# Patient Record
Sex: Female | Born: 1990 | Race: White | Hispanic: No | Marital: Married | Smoking: Never smoker
Health system: Southern US, Community
[De-identification: ages and names within clinical notes are randomized; demographics above are authoritative.]

## PROBLEM LIST (undated history)

## (undated) DIAGNOSIS — O2340 Unspecified infection of urinary tract in pregnancy, unspecified trimester: Secondary | ICD-10-CM

## (undated) DIAGNOSIS — M1990 Unspecified osteoarthritis, unspecified site: Secondary | ICD-10-CM

## (undated) DIAGNOSIS — T7840XA Allergy, unspecified, initial encounter: Secondary | ICD-10-CM

## (undated) DIAGNOSIS — M259 Joint disorder, unspecified: Secondary | ICD-10-CM

## (undated) DIAGNOSIS — D480 Neoplasm of uncertain behavior of bone and articular cartilage: Secondary | ICD-10-CM

## (undated) HISTORY — DX: Unspecified osteoarthritis, unspecified site: M19.90

## (undated) HISTORY — DX: Joint disorder, unspecified: M25.9

## (undated) HISTORY — DX: Allergy, unspecified, initial encounter: T78.40XA

## (undated) HISTORY — PX: NO PAST SURGERIES: SHX2092

## (undated) HISTORY — DX: Neoplasm of uncertain behavior of bone and articular cartilage: D48.0

## (undated) HISTORY — PX: SHOULDER SURGERY: SHX246

---
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
## 2011-01-12 ENCOUNTER — Inpatient Hospital Stay (HOSPITAL_COMMUNITY)
Admission: AD | Admit: 2011-01-12 | Discharge: 2011-01-12 | Disposition: A | Discharge status: Home / Self Care | Source: Ambulatory Visit | Attending: Obstetrics & Gynecology | Admitting: Obstetrics & Gynecology

## 2011-01-12 ENCOUNTER — Encounter (HOSPITAL_COMMUNITY): Payer: Self-pay | Admitting: *Deleted

## 2011-01-12 ENCOUNTER — Inpatient Hospital Stay (HOSPITAL_COMMUNITY)

## 2011-01-12 DIAGNOSIS — B9689 Other specified bacterial agents as the cause of diseases classified elsewhere: Secondary | ICD-10-CM | POA: Insufficient documentation

## 2011-01-12 DIAGNOSIS — Z6791 Unspecified blood type, Rh negative: Secondary | ICD-10-CM

## 2011-01-12 DIAGNOSIS — N760 Acute vaginitis: Secondary | ICD-10-CM | POA: Insufficient documentation

## 2011-01-12 DIAGNOSIS — O26899 Other specified pregnancy related conditions, unspecified trimester: Secondary | ICD-10-CM

## 2011-01-12 DIAGNOSIS — A499 Bacterial infection, unspecified: Secondary | ICD-10-CM | POA: Insufficient documentation

## 2011-01-12 DIAGNOSIS — O2390 Unspecified genitourinary tract infection in pregnancy, unspecified trimester: Secondary | ICD-10-CM | POA: Insufficient documentation

## 2011-01-12 DIAGNOSIS — O209 Hemorrhage in early pregnancy, unspecified: Secondary | ICD-10-CM

## 2011-01-12 DIAGNOSIS — O26859 Spotting complicating pregnancy, unspecified trimester: Secondary | ICD-10-CM | POA: Insufficient documentation

## 2011-01-12 HISTORY — DX: Unspecified infection of urinary tract in pregnancy, unspecified trimester: O23.40

## 2011-01-12 LAB — URINALYSIS, ROUTINE W REFLEX MICROSCOPIC
Bilirubin Urine: NEGATIVE
Ketones, ur: NEGATIVE mg/dL
pH: 6.5 (ref 5.0–8.0)

## 2011-01-12 LAB — WET PREP, GENITAL: Yeast Wet Prep HPF POC: NONE SEEN

## 2011-01-12 LAB — URINE MICROSCOPIC-ADD ON

## 2011-01-12 MED ORDER — RHO D IMMUNE GLOBULIN 1500 UNIT/2ML IJ SOLN
300.0000 ug | Freq: Once | INTRAMUSCULAR | Status: AC
  Administered 2011-01-12: 300 ug via INTRAMUSCULAR

## 2011-01-12 MED ORDER — METRONIDAZOLE 500 MG PO TABS: 500.0000 mg | ORAL_TABLET | Freq: Two times a day (BID) | ORAL | Status: AC

## 2011-01-12 NOTE — Progress Notes (Signed)
Written and verbal d/c instructions given and understanding voiced. 

## 2011-01-12 NOTE — ED Provider Notes (Signed)
History   Jenna Mendoza is a 20 y.o. year old G1P0 female at [redacted]w[redacted]d weeks gestation who presents to [facility] reporting spotting since thsi evening. She denies cramping or passage of tissue. She is getting Prenatal care through the [facility] [employer] and has a normal [place] a few weeks ago and states her blood type is A neg. She denies receiving Rhophylac.   CSN: [id number]  Arrival date & time 01/12/11  2024   None     Chief Complaint  Patient presents with  . Vaginal Bleeding    (Consider location/radiation/quality/duration/timing/severity/associated sxs/prior treatment) HPI  Past Medical History  Diagnosis Date  . Urinary tract infection during pregnancy mid december 2012    Past Surgical History  Procedure Date  . No past surgeries     Family History  Problem Relation Age of Onset  . Anesthesia problems Neg Hx   . Hypotension Neg Hx   . Malignant hyperthermia Neg Hx   . Pseudochol deficiency Neg Hx     History  Substance Use Topics  . Smoking status: Never Smoker   . Smokeless tobacco: Not on file  . Alcohol Use: No    OB History    Grav Para Term Preterm Abortions TAB SAB Ect Mult Living   1               Review of Systems: Otherwise neg  Allergies  Review of patient's allergies indicates no known allergies.  Home Medications  No current outpatient prescriptions on file.  BP 149/73  Pulse 113  Temp(Src) 98.8 F (37.1 C) (Oral)  Resp 20  Ht 5\' 6"  (1.676 m)  Wt 63.957 kg (141 lb)  BMI 22.76 kg/m2  SpO2 99%  Physical Exam  Constitutional: She is oriented to person, place, and time. She appears well-developed and well-nourished. No distress.  Cardiovascular: Normal rate.   Pulmonary/Chest: Effort normal.  Abdominal: Soft. There is no tenderness.  Genitourinary: Uterus is not tender. Cervix exhibits friability. Cervix exhibits no motion tenderness and no discharge. There is bleeding (small amount in DRB in vault.) around the vagina. No vaginal discharge found.    Neurological: She is alert and oriented to person, place, and time.  Skin: Skin is warm and dry.  Psychiatric: She has a normal mood and affect.   Results for orders placed during the hospital encounter of 01/12/11 (from the past 48 hour(s))  RH IG WORKUP, [facility]     Status: Normal (Preliminary result)   Collection Time   01/12/11  9:39 PM      Component Value Range Comment   Gestational Age(Wks) 11.6      ABO/RH(D) A NEG      Antibody Screen NEG      Unit Number [id number]      Blood Component Type RHIG      Unit division 00      Status of Unit ISSUED      Transfusion Status OK TO TRANSFUSE     WET PREP, GENITAL     Status: Abnormal   Collection Time   01/12/11 10:30 PM      Component Value Range Comment   Yeast, Wet Prep NONE SEEN  NONE SEEN     Trich, Wet Prep NONE SEEN  NONE SEEN     Clue Cells, Wet Prep FEW (*) NONE SEEN     WBC, Wet Prep HPF POC FEW (*) NONE SEEN  MANY BACTERIA SEEN   US Ob Comp Less 14 Wks  01/12/2011  *  RADIOLOGY REPORT*  Clinical Data: First trimester pregnancy.  Spotting.  OBSTETRIC <14 WK [place] AND TRANSVAGINAL OB US  Technique: Both transabdominal and transvaginal ultrasound examinations were performed for complete evaluation of the gestation as well as the maternal uterus, adnexal regions, and pelvic cul-de-sac.  Findings: A single living intrauterine pregnancy is noted with cardiac activity along 155 beats per minute and crown-rump length of 50.8 mm compatible with 11 weeks 6 days gestation, which corresponds to the assigned gestational age.  Yolk sac is visualized.  No subchorionic hemorrhage is observed.  Both ovaries appear normal, with a 1.9 cm left corpus luteum of pregnancy noted.  There is a trace amount of maternal free pelvic fluid.  IMPRESSION:  1.  Single living intrauterine pregnancy measuring at 11 weeks 6 days gestation.  No subchorionic hemorrhage. 2.  Trace free pelvic fluid.  Original Report Authenticated By: Dellia Cloud,  M.D.   FHR 161 by doppler ED Course  Procedures (including critical care time)  Rhophylac given MDM  Assessment: 1. 11.6 week IUP 2. First trimester spotting likely due to friable cervix and BV 3. Rh neg, Rhophylac given  Plan: 1. D/C home 2. Pelvic rest x 1 week 3. F/U w/ Obstetrician on [place] or MAU PRN for worsening of Sx. 4. Bleeding precautions  Katrinka Blazing, Sydell Prowell 01/12/2011 10:53 PM

## 2011-01-12 NOTE — Progress Notes (Signed)
G1 at 12 wks. Spotting noticed at 1900. Brownish at first and then light pink. Scant. Denies pain. Last IC Thurs afternoon

## 2011-01-12 NOTE — ED Notes (Signed)
Rhophylac literature given to pt earlier and states does not have any questions currently regarding injection.

## 2011-01-12 NOTE — Progress Notes (Signed)
V.Smith CNM in to see pt. Spec exam done and wet prep and GC/Chlam obtained. Pt tol well.

## 2011-01-13 LAB — RH IG WORKUP (INCLUDES ABO/RH)
ABO/RH(D): A NEG
Antibody Screen: NEGATIVE
Gestational Age(Wks): 11.6

## 2011-01-15 LAB — GC/CHLAMYDIA PROBE AMP, GENITAL
Chlamydia, DNA Probe: NEGATIVE
GC Probe Amp, Genital: NEGATIVE

## 2013-11-16 ENCOUNTER — Encounter (HOSPITAL_COMMUNITY): Payer: Self-pay | Admitting: *Deleted

## 2014-09-07 ENCOUNTER — Ambulatory Visit (INDEPENDENT_AMBULATORY_CARE_PROVIDER_SITE_OTHER): Payer: BLUE CROSS/BLUE SHIELD | Admitting: Family Medicine

## 2014-09-07 ENCOUNTER — Encounter (INDEPENDENT_AMBULATORY_CARE_PROVIDER_SITE_OTHER): Payer: Self-pay

## 2014-09-07 ENCOUNTER — Encounter: Payer: Self-pay | Admitting: Family Medicine

## 2014-09-07 VITALS — BP 117/74 | HR 68 | Temp 97.6°F | Ht 66.0 in | Wt 187.2 lb

## 2014-09-07 DIAGNOSIS — R300 Dysuria: Secondary | ICD-10-CM

## 2014-09-07 DIAGNOSIS — N3000 Acute cystitis without hematuria: Secondary | ICD-10-CM

## 2014-09-07 LAB — POCT UA - MICROSCOPIC ONLY
BACTERIA, U MICROSCOPIC: NEGATIVE
CASTS, UR, LPF, POC: NEGATIVE
CRYSTALS, UR, HPF, POC: NEGATIVE
RBC, URINE, MICROSCOPIC: NEGATIVE
Yeast, UA: NEGATIVE

## 2014-09-07 LAB — POCT URINALYSIS DIPSTICK
GLUCOSE UA: NEGATIVE
Ketones, UA: NEGATIVE
NITRITE UA: NEGATIVE
Protein, UA: NEGATIVE
SPEC GRAV UA: 1.01
Urobilinogen, UA: NEGATIVE
pH, UA: 6.5

## 2014-09-07 MED ORDER — CEFTRIAXONE SODIUM 1 G IJ SOLR
1.0000 g | INTRAMUSCULAR | Status: AC
  Administered 2014-09-07: 1 g via INTRAMUSCULAR

## 2014-09-07 NOTE — Progress Notes (Signed)
BP 117/74 mmHg  Pulse 68  Temp(Src) 97.6 F (36.4 C) (Oral)  Ht 5\' 6"  (1.676 m)  Wt 187 lb 3.2 oz (84.913 kg)  BMI 30.23 kg/m2  Breastfeeding? Unknown   Subjective:    Patient ID: [name], female    DOB: Dec 30, 1990, 24 y.o.   MRN: [id number]  HPI: LUCIE FRIEDLANDER is a 24 y.o. female presenting on 09/07/2014 for Urinary Tract Infection   HPI UTI Patient has had dysuria and bladder fullness for the past day and a half. She does get recurrent UTIs every few months but this time it's been at least 4-5 months as his last one. She has taken different antibiotics for these previously. She denies any fevers or chills, she denies any abdominal pain, she denies any hematuria. She denies any vaginal irritation, vaginal discharge. She denies any flank pain  Relevant past medical, surgical, family and social history reviewed and updated as indicated. Interim medical history since our last visit reviewed. Allergies and medications reviewed and updated.  Review of Systems  Constitutional: Negative for fever and chills.  HENT: Negative for congestion, ear discharge and ear pain.   Eyes: Negative for redness and visual disturbance.  Respiratory: Negative for chest tightness and shortness of breath.   Cardiovascular: Negative for chest pain and leg swelling.  Gastrointestinal: Negative for abdominal pain.  Genitourinary: Positive for dysuria. Negative for urgency, hematuria, flank pain, difficulty urinating and dyspareunia.  Musculoskeletal: Negative for back pain and gait problem.  Skin: Negative for rash.  Neurological: Negative for dizziness, light-headedness and headaches.  Psychiatric/Behavioral: Negative for behavioral problems and agitation.  All other systems reviewed and are negative.   Per HPI unless specifically indicated above Social History  Substance Use Topics  . Smoking status: Never Smoker   . Smokeless tobacco: None  . Alcohol Use: 0.6 oz/week    1 Standard  drinks or equivalent per week   Family History  Problem Relation Age of Onset  . Anesthesia problems Neg Hx   . Hypotension Neg Hx   . Malignant hyperthermia Neg Hx   . Pseudochol deficiency Neg Hx   . Diabetes Mellitus II Paternal Grandfather   . Hypertension Father   . Breast cancer Maternal Grandmother   . Schizophrenia Mother   . Bipolar disorder Mother       Medication List       This list is accurate as of: 09/07/14  3:59 PM.  Always use your most recent med list.               levonorgestrel 20 MCG/24HR IUD  Commonly known as:  MIRENA  1 each by Intrauterine route once.           Objective:    BP 117/74 mmHg  Pulse 68  Temp(Src) 97.6 F (36.4 C) (Oral)  Ht 5\' 6"  (1.676 m)  Wt 187 lb 3.2 oz (84.913 kg)  BMI 30.23 kg/m2  Breastfeeding? Unknown  Wt Readings from Last 3 Encounters:  09/07/14 187 lb 3.2 oz (84.913 kg)  01/12/11 141 lb (63.957 kg) (70 %*, Z = 0.53)   * Growth percentiles are based on CDC 2-20 Years data.    Physical Exam  Constitutional: She is oriented to person, place, and time. She appears well-developed and well-nourished. No distress.  Eyes: Conjunctivae and EOM are normal.  Cardiovascular: Normal rate and regular rhythm.   No murmur heard. Pulmonary/Chest: Effort normal and breath sounds normal. No respiratory distress. She has no wheezes.  Abdominal: Soft. Normal appearance and bowel sounds are normal. There is no hepatosplenomegaly. There is no tenderness (no tenderness to palpation). There is no rebound, no guarding and no CVA tenderness.  Musculoskeletal: Normal range of motion. She exhibits no edema or tenderness.  Neurological: She is alert and oriented to person, place, and time. Coordination normal.  Skin: Skin is warm and dry. No rash noted. She is not diaphoretic.  Psychiatric: She has a normal mood and affect. Her behavior is normal.  Vitals reviewed.   Results for orders placed or performed in visit on 09/07/14  POCT  UA - Microscopic Only  Result Value Ref Range   WBC, Ur, HPF, POC 10-15    RBC, urine, microscopic neg    Bacteria, U Microscopic neg    Mucus, UA occ    Epithelial cells, urine per micros mod    Crystals, Ur, HPF, POC neg    Casts, Ur, LPF, POC neg    Yeast, UA neg   POCT urinalysis dipstick  Result Value Ref Range   Color, UA orange    Clarity, UA clear    Glucose, UA neg    Bilirubin, UA small    Ketones, UA neg    Spec Grav, UA 1.010    Blood, UA small    pH, UA 6.5    Protein, UA neg    Urobilinogen, UA negative    Nitrite, UA neg    Leukocytes, UA small (1+) (A) Negative      Assessment & Plan:   Problem List Items Addressed This Visit    None    Visit Diagnoses    Dysuria    -  Primary    Relevant Orders    POCT UA - Microscopic Only (Completed)    POCT urinalysis dipstick (Completed)    Acute cystitis without hematuria        Patient has recurrent UTIs but RBCs a gynecological physician for this. Just needs treatment for this UTI today    Relevant Medications    cefTRIAXone (ROCEPHIN) injection 1 g (Completed)        Follow up plan: Return if symptoms worsen or fail to improve.  Caryl Pina, MD [facility] 09/07/2014, 3:59 PM

## 2014-09-07 NOTE — Patient Instructions (Signed)

## 2014-09-13 ENCOUNTER — Encounter: Payer: Self-pay | Admitting: Family Medicine

## 2014-09-13 ENCOUNTER — Ambulatory Visit (INDEPENDENT_AMBULATORY_CARE_PROVIDER_SITE_OTHER): Payer: BLUE CROSS/BLUE SHIELD | Admitting: Family Medicine

## 2014-09-13 DIAGNOSIS — M259 Joint disorder, unspecified: Secondary | ICD-10-CM | POA: Insufficient documentation

## 2014-09-15 NOTE — Progress Notes (Signed)
Canceled encounter 

## 2014-11-12 ENCOUNTER — Ambulatory Visit (INDEPENDENT_AMBULATORY_CARE_PROVIDER_SITE_OTHER): Admitting: Family Medicine

## 2014-11-12 ENCOUNTER — Encounter: Payer: Self-pay | Admitting: Family Medicine

## 2014-11-12 VITALS — BP 120/71 | HR 84 | Temp 97.8°F | Ht 66.0 in | Wt 177.2 lb

## 2014-11-12 DIAGNOSIS — Z23 Encounter for immunization: Secondary | ICD-10-CM

## 2014-11-12 DIAGNOSIS — L739 Follicular disorder, unspecified: Secondary | ICD-10-CM

## 2014-11-12 MED ORDER — SULFAMETHOXAZOLE-TRIMETHOPRIM 800-160 MG PO TABS: 1.0000 | ORAL_TABLET | Freq: Two times a day (BID) | ORAL | Status: DC

## 2014-11-12 NOTE — Progress Notes (Signed)
BP 120/71 mmHg  Pulse 84  Temp(Src) 97.8 F (36.6 C)  Ht 5\' 6"  (1.676 m)  Wt 177 lb 3.2 oz (80.377 kg)  BMI 28.61 kg/m2   Subjective:    Patient ID: Jenna Mendoza, female    DOB: [birth year], 24 y.o.   MRN: [id number]  HPI: Jenna Mendoza is a 24 y.o. female presenting on 11/12/2014 for Abcess on upper right leg/groin area   HPI Infected hair Patient has one spot on her right inner thigh which has a bump from what she thinks is an infected hair follicle. She denies any fevers or chills but there is redness in the lump in the area. She has never had similar ones before. The spot is tender to the touch. It is very small of less than a quarter centimeter in size  Relevant past medical, surgical, family and social history reviewed and updated as indicated. Interim medical history since our last visit reviewed. Allergies and medications reviewed and updated.  Review of Systems  Constitutional: Negative for fever and chills.  HENT: Negative for congestion, ear discharge and ear pain.   Eyes: Negative for redness and visual disturbance.  Respiratory: Negative for chest tightness and shortness of breath.   Cardiovascular: Negative for chest pain and leg swelling.  Genitourinary: Negative for dysuria and difficulty urinating.  Musculoskeletal: Negative for back pain and gait problem.  Skin: Positive for rash.  Neurological: Negative for light-headedness and headaches.  Psychiatric/Behavioral: Negative for behavioral problems and agitation.  All other systems reviewed and are negative.   Per HPI unless specifically indicated above     Medication List       This list is accurate as of: 11/12/14  1:43 PM.  Always use your most recent med list.               SKYLA 13.5 MG Iud  Generic drug:  Levonorgestrel  by Intrauterine route.     sulfamethoxazole-trimethoprim 800-160 MG tablet  Commonly known as:  BACTRIM DS  Take 1 tablet by mouth 2 (two) times daily.           Objective:    BP 120/71 mmHg  Pulse 84  Temp(Src) 97.8 F (36.6 C)  Ht 5\' 6"  (1.676 m)  Wt 177 lb 3.2 oz (80.377 kg)  BMI 28.61 kg/m2  Wt Readings from Last 3 Encounters:  11/12/14 177 lb 3.2 oz (80.377 kg)  09/13/14 186 lb (84.369 kg)  09/07/14 187 lb 3.2 oz (84.913 kg)    Physical Exam  Constitutional: She is oriented to person, place, and time. She appears well-developed and well-nourished. No distress.  Eyes: Conjunctivae and EOM are normal.  Cardiovascular: Normal rate, regular rhythm, normal heart sounds and intact distal pulses.   No murmur heard. Pulmonary/Chest: Effort normal and breath sounds normal. No respiratory distress. She has no wheezes.  Musculoskeletal: Normal range of motion. She exhibits no edema or tenderness.  Neurological: She is alert and oriented to person, place, and time. Coordination normal.  Skin: Skin is warm and dry. Lesion noted. No rash noted. She is not diaphoretic.     Psychiatric: She has a normal mood and affect. Her behavior is normal.  Nursing note and vitals reviewed.   Results for orders placed or performed in visit on 09/07/14  POCT UA - Microscopic Only  Result Value Ref Range   WBC, Ur, HPF, POC 10-15    RBC, urine, microscopic neg    Bacteria, U Microscopic neg  Mucus, UA occ    Epithelial cells, urine per micros mod    Crystals, Ur, HPF, POC neg    Casts, Ur, LPF, POC neg    Yeast, UA neg   POCT urinalysis dipstick  Result Value Ref Range   Color, UA orange    Clarity, UA clear    Glucose, UA neg    Bilirubin, UA small    Ketones, UA neg    Spec Grav, UA 1.010    Blood, UA small    pH, UA 6.5    Protein, UA neg    Urobilinogen, UA negative    Nitrite, UA neg    Leukocytes, UA small (1+) (A) Negative      Assessment & Plan:   Problem List Items Addressed This Visit    None    Visit Diagnoses    Folliculitis    -  Primary    We'll treat with Bactrim, she is not breast-feeding currently. If not improved in  a week come back for I&D of the area.    Relevant Medications    sulfamethoxazole-trimethoprim (BACTRIM DS) 800-160 MG tablet        Follow up plan: Return in about 1 week (around 11/19/2014), or if symptoms worsen or fail to improve, for IUD removal and well woman exam and Pap.  Caryl Pina, MD [facility] 11/12/2014, 1:43 PM

## 2014-11-19 ENCOUNTER — Ambulatory Visit (INDEPENDENT_AMBULATORY_CARE_PROVIDER_SITE_OTHER): Payer: BLUE CROSS/BLUE SHIELD | Admitting: Family Medicine

## 2014-11-19 ENCOUNTER — Encounter: Payer: Self-pay | Admitting: Family Medicine

## 2014-11-19 VITALS — BP 135/81 | HR 108 | Temp 97.1°F | Ht 66.0 in | Wt 178.2 lb

## 2014-11-19 DIAGNOSIS — Z01419 Encounter for gynecological examination (general) (routine) without abnormal findings: Secondary | ICD-10-CM | POA: Diagnosis not present

## 2014-11-19 DIAGNOSIS — Z0000 Encounter for general adult medical examination without abnormal findings: Secondary | ICD-10-CM | POA: Diagnosis not present

## 2014-11-19 DIAGNOSIS — Z30432 Encounter for removal of intrauterine contraceptive device: Secondary | ICD-10-CM | POA: Diagnosis not present

## 2014-11-19 NOTE — Patient Instructions (Signed)
Preparing for Pregnancy Before trying to become pregnant, make an appointment with your health care provider (preconception care). The goal is to help you have a healthy, safe pregnancy. At your first appointment, your health care provider will:   Do a complete physical exam, including a Pap test.  Take a complete medical history.  Give you advice and help you resolve any problems. PRECONCEPTION CHECKLIST Here is a list of the basics to cover with your health care provider at your preconception visit:  Medical history.  Tell your health care provider about any diseases you have had. Many diseases can affect your pregnancy.  Include your partner's medical history and family history.  Make sure you have been tested for sexually transmitted infections (STIs). These can affect your pregnancy. In some cases, they can be passed to your baby. Tell your health care provider about any history of STIs.  Make sure your health care provider knows about any previous problems you have had with conception or pregnancy.  Tell your health care provider about any medicine you take. This includes herbal supplements and over-the-counter medicines.  Make sure all your immunizations are up to date. You may need to make additional appointments.  Ask your health care provider if you need any vaccinations or if there are any you should avoid.  Diet.  It is especially important to eat a healthy, balanced diet with the right nutrients when you are pregnant.  Ask your health care provider to help you get to a healthy weight before pregnancy.  If you are overweight, you are at higher risk for certain complications. These include high blood pressure, diabetes, and preterm birth.  If you are underweight, you are more likely to have a low-birth-weight baby.  Lifestyle.  Tell your health care provider about lifestyle factors such as alcohol use, drug use, or smoking.  Describe any harmful substances you may  be exposed to at work or home. These can include chemicals, pesticides, and radiation.  Mental health.  Let your health care provider know if you have been feeling depressed or anxious.  Let your health care provider know if you have a history of substance abuse.  Let your health care provider know if you do not feel safe at home. HOME INSTRUCTIONS TO PREPARE FOR PREGNANCY Follow your health care provider's advice and instructions.   Keep an accurate record of your menstrual periods. This makes it easier for your health care provider to determine your baby's due date.  Begin taking prenatal vitamins and folic acid supplements daily. Take them as directed by your health care provider.  Eat a balanced diet. Get help from a nutrition counselor if you have questions or need help.  Get regular exercise. Try to be active for at least 30 minutes a day most days of the week.  Quit smoking, if you smoke.  Do not drink alcohol.  Do not take illegal drugs.  Get medical problems, such as diabetes or high blood pressure, under control.  If you have diabetes, make sure you do the following:  Have good blood sugar control. If you have type 1 diabetes, use multiple daily doses of insulin. Do not use split-dose or premixed insulin.  Have an eye exam by a qualified eye care professional trained in caring for people with diabetes.  Get evaluated by your health care provider for cardiovascular disease.  Get to a healthy weight. If you are overweight or obese, reduce your weight with the help of a qualified health   professional such as a Firefighter. Ask your health care provider what the right weight range is for you. HOW DO I KNOW I AM PREGNANT? You may be pregnant if you have been sexually active and you miss your period. Symptoms of early pregnancy include:   Mild cramping.  Very light vaginal bleeding (spotting).  Feeling unusually tired.  Morning sickness. If you have any of  these symptoms, take a home pregnancy test. These tests look for a hormone called human chorionic gonadotropin (hCG) in your urine. Your body begins to make this hormone during early pregnancy. These tests are very accurate. Wait until at least the first day you miss your period to take one. If you get a positive result, call your health care provider to make appointments for prenatal care. WHAT SHOULD I DO IF I BECOME PREGNANT?  Make an appointment with your health care provider by week 12 of your pregnancy at the latest.  Do not smoke. Smoking can be harmful to your baby.  Do not drink alcoholic beverages. Alcohol is related to a number of birth defects.  Avoid toxic odors and chemicals.  You may continue to have sexual intercourse if it does not cause pain or other problems, such as vaginal bleeding.   This information is not intended to replace advice given to you by your health care provider. Make sure you discuss any questions you have with your health care provider.   Document Released: 12/15/2007 Document Revised: 01/22/2014 Document Reviewed: 12/08/2012 Elsevier Interactive Patient Education [employer].

## 2014-11-19 NOTE — Progress Notes (Signed)
BP 135/81 mmHg  Pulse 108  Temp(Src) 97.1 F (36.2 C) (Oral)  Ht 5\' 6"  (1.676 m)  Wt 178 lb 3.2 oz (80.831 kg)  BMI 28.78 kg/m2   Subjective:    Patient ID: Jenna Mendoza, female    DOB: [birth year], 24 y.o.   MRN: [id number]  HPI: Jenna Mendoza is a 24 y.o. female presenting on 11/19/2014 for IUD removal and Annual Exam   HPI Adult well exam with gynecological exam Patient presents today for her Pap and well woman exam. She has a scaling does not have regular periods. She bleeds every couple weeks for a couple days. She has had the IUD since she had her child is now getting close to 68 years old. Patient denies headaches, blurred vision, chest pains, shortness of breath, or weakness.   IUD removal She also comes in for an IUD removal today because she is planning on having another child in getting pregnant. She currently has a Insurance underwriter and we will take that out for her.  Relevant past medical, surgical, family and social history reviewed and updated as indicated. Interim medical history since our last visit reviewed. Allergies and medications reviewed and updated.  Review of Systems  Constitutional: Negative for fever and chills.  HENT: Negative for congestion, ear discharge and ear pain.   Eyes: Negative for redness and visual disturbance.  Respiratory: Negative for chest tightness and shortness of breath.   Cardiovascular: Negative for chest pain and leg swelling.  Gastrointestinal: Negative for abdominal pain, diarrhea and constipation.  Genitourinary: Positive for menstrual problem (irregular bleeding while on Skyla). Negative for dysuria, frequency, flank pain, vaginal bleeding, vaginal discharge, difficulty urinating, vaginal pain and pelvic pain.  Musculoskeletal: Negative for back pain and gait problem.  Skin: Negative for rash.  Neurological: Negative for dizziness, light-headedness and headaches.  Psychiatric/Behavioral: Negative for behavioral problems and agitation.   All other systems reviewed and are negative.   Per HPI unless specifically indicated above     Medication List       This list is accurate as of: 11/19/14 10:49 AM.  Always use your most recent med list.               SKYLA 13.5 MG Iud  Generic drug:  Levonorgestrel  by Intrauterine route.           Objective:    BP 135/81 mmHg  Pulse 108  Temp(Src) 97.1 F (36.2 C) (Oral)  Ht 5\' 6"  (1.676 m)  Wt 178 lb 3.2 oz (80.831 kg)  BMI 28.78 kg/m2  Wt Readings from Last 3 Encounters:  11/19/14 178 lb 3.2 oz (80.831 kg)  11/12/14 177 lb 3.2 oz (80.377 kg)  09/13/14 186 lb (84.369 kg)    Physical Exam  Constitutional: She is oriented to person, place, and time. She appears well-developed and well-nourished. No distress.  Eyes: Conjunctivae and EOM are normal. Pupils are equal, round, and reactive to light.  Neck: Neck supple. No thyromegaly present.  Cardiovascular: Normal rate, regular rhythm, normal heart sounds and intact distal pulses.   No murmur heard. Pulmonary/Chest: Effort normal and breath sounds normal. No respiratory distress. She has no wheezes. Right breast exhibits no inverted nipple, no mass, no nipple discharge, no skin change and no tenderness. Left breast exhibits no inverted nipple, no mass, no nipple discharge, no skin change and no tenderness. Breasts are symmetrical.  Abdominal: Soft. Bowel sounds are normal. She exhibits no distension. There is no tenderness. There is  no rebound and no guarding.  Genitourinary: Vagina normal and uterus normal. No breast swelling, tenderness, discharge or bleeding. Pelvic exam was performed with patient supine. There is no rash or lesion on the right labia. There is no rash or lesion on the left labia. Uterus is not deviated, not enlarged, not fixed and not tender. Cervix exhibits no motion tenderness, no discharge and no friability. Right adnexum displays no mass and no tenderness. Left adnexum displays no mass and no  tenderness.    Musculoskeletal: Normal range of motion. She exhibits no edema.  Lymphadenopathy:    She has no cervical adenopathy.    She has no axillary adenopathy.  Neurological: She is alert and oriented to person, place, and time. Coordination normal.  Skin: Skin is warm and dry. No rash noted. She is not diaphoretic.  Psychiatric: She has a normal mood and affect. Her behavior is normal.  Vitals reviewed.   Results for orders placed or performed in visit on 09/07/14  POCT UA - Microscopic Only  Result Value Ref Range   WBC, Ur, HPF, POC 10-15    RBC, urine, microscopic neg    Bacteria, U Microscopic neg    Mucus, UA occ    Epithelial cells, urine per micros mod    Crystals, Ur, HPF, POC neg    Casts, Ur, LPF, POC neg    Yeast, UA neg   POCT urinalysis dipstick  Result Value Ref Range   Color, UA orange    Clarity, UA clear    Glucose, UA neg    Bilirubin, UA small    Ketones, UA neg    Spec Grav, UA 1.010    Blood, UA small    pH, UA 6.5    Protein, UA neg    Urobilinogen, UA negative    Nitrite, UA neg    Leukocytes, UA small (1+) (A) Negative   IUD removal: Strings visible on exam ring forceps used to hold the straining and IUD was removed without complication. No bleeding side effects noted.    Assessment & Plan:   Problem List Items Addressed This Visit    None    Visit Diagnoses    Annual physical exam    -  Primary    Relevant Orders    Pap IG, CT/NG w/ reflex HPV when ASC-U    Encounter for IUD removal        Has plans to have another baby and wants IUD removed. Removed without complication or issue. Instructed to start taking prenatal now        Follow up plan: Return if symptoms worsen or fail to improve.  Caryl Pina, MD [facility] 11/19/2014, 10:49 AM

## 2014-11-25 LAB — PAP IG, CT-NG, RFX HPV ASCU
Chlamydia, Nuc. Acid Amp: NEGATIVE
Gonococcus by Nucleic Acid Amp: NEGATIVE
PAP SMEAR COMMENT: 0

## 2014-12-13 ENCOUNTER — Telehealth: Payer: Self-pay | Admitting: Family Medicine

## 2014-12-13 NOTE — Telephone Encounter (Signed)
Stp and she is c/o urinary urgency, frequency and then burning at the end of voiding. We didn't have any openings today. Can we call in something for her please advise?

## 2014-12-13 NOTE — Telephone Encounter (Signed)
She would have to come in and get it checked

## 2014-12-14 ENCOUNTER — Encounter: Payer: Self-pay | Admitting: Family Medicine

## 2014-12-14 ENCOUNTER — Ambulatory Visit (INDEPENDENT_AMBULATORY_CARE_PROVIDER_SITE_OTHER): Payer: BLUE CROSS/BLUE SHIELD | Admitting: Family Medicine

## 2014-12-14 ENCOUNTER — Encounter (INDEPENDENT_AMBULATORY_CARE_PROVIDER_SITE_OTHER): Payer: Self-pay

## 2014-12-14 VITALS — BP 122/73 | HR 78 | Temp 97.7°F | Ht 66.0 in | Wt 177.8 lb

## 2014-12-14 DIAGNOSIS — R300 Dysuria: Secondary | ICD-10-CM

## 2014-12-14 DIAGNOSIS — R350 Frequency of micturition: Secondary | ICD-10-CM | POA: Diagnosis not present

## 2014-12-14 LAB — POCT UA - MICROSCOPIC ONLY
CASTS, UR, LPF, POC: NEGATIVE
CRYSTALS, UR, HPF, POC: NEGATIVE
Mucus, UA: NEGATIVE
RBC, urine, microscopic: NEGATIVE
Yeast, UA: NEGATIVE

## 2014-12-14 LAB — POCT URINALYSIS DIPSTICK

## 2014-12-14 MED ORDER — SULFAMETHOXAZOLE-TRIMETHOPRIM 400-80 MG PO TABS: 1.0000 | ORAL_TABLET | Freq: Two times a day (BID) | ORAL | Status: DC

## 2014-12-14 NOTE — Progress Notes (Signed)
   Subjective:    Patient ID: [name], female    DOB: May 27, 1990, 24 y.o.   MRN: [id number]  HPI 24 year old female with recurring symptoms of urinary tract infection. Symptoms include frequency and dysuria. She does not say any connection with sexual activity. She has had symptoms since she was a teenager before she became sexually active. I reviewed her chart to see if cultures would give [place] any suggestion of organism or appropriate antibiotic but I did not find any culture results that were relevant. The last time she was in the office and treated she received an injection of Rocephin.  Patient Active Problem List   Diagnosis Date Noted  . Joint disorder    Outpatient Encounter Prescriptions as of 12/14/2014  Medication Sig  . PRENATAL 28-0.8 MG TABS Take 1 tablet by mouth daily.   No facility-administered encounter medications on file as of 12/14/2014.      Review of Systems  Constitutional: Negative.   Endocrine: Positive for polyuria.  Genitourinary: Positive for dysuria and frequency.       Objective:   Physical Exam  Constitutional: She appears well-developed and well-nourished.  Cardiovascular: Normal rate.   Pulmonary/Chest: Effort normal.  Abdominal: Soft. There is no tenderness.          Assessment & Plan:  1. Dysuria Urinalysis has a few white cells but is not especially remarkable. She remembers taking Bactrim before so we will go with that antibiotic. Also ask her to drink plenty of fluids. If this is the right antibiotic would expect some relief of symptoms within 24 hours. She has not experienced some improvement would consider changing to Macrobid - POCT UA - Microscopic Only - POCT urinalysis dipstick  2. Urinary frequency   Wardell Honour MD - POCT UA - Microscopic Only - POCT urinalysis dipstick

## 2014-12-16 NOTE — Telephone Encounter (Signed)
Patient was evaluated on 11/29

## 2015-01-16 NOTE — L&D Delivery Note (Signed)
Delivery Note  First Stage: Labor onset: 1000 on 11/20/2015 Augmentation : none Analgesia /Anesthesia intrapartum: none SROM at 0730  Second Stage: Complete dilation at 0920 Onset of pushing at 0920 FHR second stage 110  Delivery of a viable female at [phone] by CNM in OA position with restitution to LOA Loos nuchal cord x 1 loop Cord double clamped after cessation of pulsation, cut by FOB Cord blood sample collected   Third Stage: Placenta delivered via Delena Bali intact with 3 VC @ [phone] Placenta disposition: L&D Uterine tone firm / bleeding scant  No laceration identified  Est. Blood Loss (mL): 50  Complications: No abx treatment prior to delivery for (+) GBS  Mom to postpartum.  Baby to Couplet care / Skin to Skin.  Newborn: Birth Weight: 5 lbs 12.2 oz  Apgar Scores: 9/9 Feeding planned: breast  Laury Deep, M  MSN, CNM 11/21/2015, 9:49 AM

## 2015-02-16 ENCOUNTER — Encounter: Payer: Self-pay | Admitting: Family Medicine

## 2015-02-16 ENCOUNTER — Ambulatory Visit (INDEPENDENT_AMBULATORY_CARE_PROVIDER_SITE_OTHER): Payer: BLUE CROSS/BLUE SHIELD | Admitting: Family Medicine

## 2015-02-16 VITALS — BP 130/83 | HR 79 | Temp 97.9°F | Ht 66.0 in | Wt 178.2 lb

## 2015-02-16 DIAGNOSIS — S46011A Strain of muscle(s) and tendon(s) of the rotator cuff of right shoulder, initial encounter: Secondary | ICD-10-CM

## 2015-02-16 MED ORDER — METHYLPREDNISOLONE ACETATE 80 MG/ML IJ SUSP
80.0000 mg | Freq: Once | INTRAMUSCULAR | Status: AC
  Administered 2015-02-16: 80 mg via INTRA_ARTICULAR

## 2015-02-16 NOTE — Progress Notes (Signed)
BP 130/83 mmHg  Pulse 79  Temp(Src) 97.9 F (36.6 C) (Oral)  Ht 5\' 6"  (1.676 m)  Wt 178 lb 3.2 oz (80.831 kg)  BMI 28.78 kg/m2   Subjective:    Patient ID: Jenna Mendoza, female    DOB: Jun 13, 1990, 25 y.o.   MRN: [id number]  HPI: Jenna Mendoza is a 25 y.o. female presenting on 02/16/2015 for Right shoulder pain   HPI Shoulder pain Patient has been having worsening shoulder pain over the past 6 months but it's especially started worsening over the past month. She has a lot of pain and tenderness with overhead movement of the right arm and reaching behind her back. She has been taking Advil and doing ice and heat without much success. She denies any numbness or weakness but the pain does prohibit her from using that arm is much especially doing her hair as well as hard. No overlying skin changes or erythema or fevers or chills.  Relevant past medical, surgical, family and social history reviewed and updated as indicated. Interim medical history since our last visit reviewed. Allergies and medications reviewed and updated.  Review of Systems  Constitutional: Negative for fever and chills.  HENT: Negative for congestion, ear discharge and ear pain.   Eyes: Negative for redness and visual disturbance.  Respiratory: Negative for chest tightness and shortness of breath.   Cardiovascular: Negative for chest pain and leg swelling.  Genitourinary: Negative for dysuria and difficulty urinating.  Musculoskeletal: Positive for arthralgias. Negative for back pain, joint swelling and gait problem.  Skin: Negative for rash.  Neurological: Negative for light-headedness and headaches.  Psychiatric/Behavioral: Negative for behavioral problems and agitation.  All other systems reviewed and are negative.   Per HPI unless specifically indicated above     Medication List       This list is accurate as of: 02/16/15 11:51 AM.  Always use your most recent med list.               PRENATAL  28-0.8 MG Tabs  Take 1 tablet by mouth daily.           Objective:    BP 130/83 mmHg  Pulse 79  Temp(Src) 97.9 F (36.6 C) (Oral)  Ht 5\' 6"  (1.676 m)  Wt 178 lb 3.2 oz (80.831 kg)  BMI 28.78 kg/m2  Wt Readings from Last 3 Encounters:  02/16/15 178 lb 3.2 oz (80.831 kg)  12/14/14 177 lb 12.8 oz (80.65 kg)  11/19/14 178 lb 3.2 oz (80.831 kg)    Physical Exam  Constitutional: She is oriented to person, place, and time. She appears well-developed and well-nourished. No distress.  Eyes: Conjunctivae and EOM are normal. Pupils are equal, round, and reactive to light.  Cardiovascular: Normal rate, regular rhythm, normal heart sounds and intact distal pulses.   No murmur heard. Pulmonary/Chest: Effort normal and breath sounds normal. No respiratory distress. She has no wheezes.  Musculoskeletal: Normal range of motion. She exhibits no edema.       Right shoulder: She exhibits tenderness (minimal tenderness to palpation but tenderness increases with Hawkins impingement sign and abduction and internal rotation.). She exhibits normal range of motion, no bony tenderness, no swelling, no effusion, no crepitus, no deformity, normal pulse and normal strength.  Neurological: She is alert and oriented to person, place, and time. Coordination normal.  Skin: Skin is warm and dry. No rash noted. She is not diaphoretic.  Psychiatric: She has a normal mood and affect. Her  behavior is normal.  Nursing note and vitals reviewed.  Subacromial injection: Risk factors of bleeding and infection discussed with patient and patient is agreeable towards injection. Patient prepped with Betadine. Posterior approach towards injection used. Injected 80 mg of Depo-Medrol and 1 mL of 2% lidocaine. Patient tolerated procedure well and no side effects from noted. Minimal to no bleeding. Simple bandage applied after.     Assessment & Plan:   Problem List Items Addressed This Visit    None    Visit Diagnoses     Rotator cuff strain, right, initial encounter    -  Primary    He has been using Advil and icing but has not seen any improvement, is been going on for 5 months, will try injection if not improved orthopedic referral    Relevant Medications    methylPREDNISolone acetate (DEPO-MEDROL) injection 80 mg (Start on 02/16/2015 12:00 PM)        Follow up plan: No Follow-up on file.  Counseling provided for all of the vaccine components No orders of the defined types were placed in this encounter.    Caryl Pina, MD [facility] 02/16/2015, 11:51 AM

## 2015-03-14 ENCOUNTER — Telehealth: Payer: Self-pay | Admitting: Family Medicine

## 2015-03-14 DIAGNOSIS — S46019S Strain of muscle(s) and tendon(s) of the rotator cuff of unspecified shoulder, sequela: Secondary | ICD-10-CM

## 2015-03-14 NOTE — Telephone Encounter (Signed)
Refer to ortho.

## 2015-03-14 NOTE — Telephone Encounter (Signed)
Referral ordered, pt is aware.

## 2015-03-31 ENCOUNTER — Ambulatory Visit (INDEPENDENT_AMBULATORY_CARE_PROVIDER_SITE_OTHER): Payer: BLUE CROSS/BLUE SHIELD | Admitting: Family Medicine

## 2015-03-31 ENCOUNTER — Encounter: Payer: Self-pay | Admitting: Family Medicine

## 2015-03-31 ENCOUNTER — Encounter (INDEPENDENT_AMBULATORY_CARE_PROVIDER_SITE_OTHER): Payer: Self-pay

## 2015-03-31 VITALS — BP 136/8 | HR 98 | Temp 98.9°F | Ht 66.0 in | Wt 180.8 lb

## 2015-03-31 DIAGNOSIS — Z3200 Encounter for pregnancy test, result unknown: Secondary | ICD-10-CM

## 2015-03-31 LAB — PREGNANCY, URINE: PREG TEST UR: POSITIVE — AB

## 2015-04-01 NOTE — Progress Notes (Signed)
BP 136/8 mmHg  Pulse 98  Temp(Src) 98.9 F (37.2 C) (Oral)  Ht 5\' 6"  (1.676 m)  Wt 180 lb 12.8 oz (82.01 kg)  BMI 29.20 kg/m2  LMP 02/27/2015 (Approximate)   Subjective:    Patient ID: Jenna Mendoza, female    DOB: Dec 23, 1990, 25 y.o.   MRN: [id number]  HPI: Jenna Mendoza is a 25 y.o. female presenting on 03/31/2015 for Possible Pregnancy   HPI Urine pregnancy test positive Patient is coming in today for a positive urine pregnancy test to confirm whether or not she is truly pregnant. She is artery taking prenatal vitamins. She had her IUD pulled not too long ago so that she could try and get pregnant. She was just surprised at how quickly it happened. Her LMP was 02/27/2015 and she has an appointment with OB/GYN on 04/13/2015. She denies any major issues with nausea or vomiting or abdominal pain. She has no vaginal discharge or bleeding.  Relevant past medical, surgical, family and social history reviewed and updated as indicated. Interim medical history since our last visit reviewed. Allergies and medications reviewed and updated.  Review of Systems  Constitutional: Negative for fever and chills.  HENT: Negative for congestion, ear discharge and ear pain.   Eyes: Negative for redness and visual disturbance.  Respiratory: Negative for chest tightness and shortness of breath.   Cardiovascular: Negative for chest pain and leg swelling.  Gastrointestinal: Negative for abdominal pain.  Genitourinary: Negative for dysuria, vaginal bleeding, vaginal discharge, difficulty urinating, vaginal pain and menstrual problem.  Musculoskeletal: Negative for back pain and gait problem.  Skin: Negative for rash.  Neurological: Negative for light-headedness and headaches.  Psychiatric/Behavioral: Negative for behavioral problems and agitation.  All other systems reviewed and are negative.   Per HPI unless specifically indicated above     Medication List       This list is accurate as  of: 03/31/15 11:59 PM.  Always use your most recent med list.               PRENATAL 28-0.8 MG Tabs  Take 1 tablet by mouth daily.           Objective:    BP 136/8 mmHg  Pulse 98  Temp(Src) 98.9 F (37.2 C) (Oral)  Ht 5\' 6"  (1.676 m)  Wt 180 lb 12.8 oz (82.01 kg)  BMI 29.20 kg/m2  LMP 02/27/2015 (Approximate)  Wt Readings from Last 3 Encounters:  03/31/15 180 lb 12.8 oz (82.01 kg)  02/16/15 178 lb 3.2 oz (80.831 kg)  12/14/14 177 lb 12.8 oz (80.65 kg)    Physical Exam  Constitutional: She is oriented to person, place, and time. She appears well-developed and well-nourished. No distress.  Eyes: Conjunctivae and EOM are normal. Pupils are equal, round, and reactive to light.  Cardiovascular: Normal rate, regular rhythm, normal heart sounds and intact distal pulses.   No murmur heard. Pulmonary/Chest: Effort normal and breath sounds normal. No respiratory distress. She has no wheezes.  Abdominal: Soft. Bowel sounds are normal. She exhibits no distension. There is no tenderness. There is no rebound.  Musculoskeletal: Normal range of motion. She exhibits no edema or tenderness.  Neurological: She is alert and oriented to person, place, and time. Coordination normal.  Skin: Skin is warm and dry. No rash noted. She is not diaphoretic.  Psychiatric: She has a normal mood and affect. Her behavior is normal.  Nursing note and vitals reviewed.   Results for orders placed or  performed in visit on 03/31/15  Pregnancy, urine  Result Value Ref Range   Preg Test, Ur Positive (A) Negative      Assessment & Plan:   Problem List Items Addressed This Visit    None    Visit Diagnoses    Possible pregnancy, not confirmed    -  Primary    Relevant Orders    Pregnancy, urine (Completed)        Follow up plan: Return if symptoms worsen or fail to improve.  Counseling provided for all of the vaccine components Orders Placed This Encounter  Procedures  . Pregnancy, urine     Caryl Pina, MD [facility] 03/31/2015, 4:06 PM

## 2015-05-17 ENCOUNTER — Telehealth: Payer: Self-pay | Admitting: Family Medicine

## 2015-05-17 ENCOUNTER — Encounter: Payer: Self-pay | Admitting: Family Medicine

## 2015-05-17 ENCOUNTER — Ambulatory Visit (INDEPENDENT_AMBULATORY_CARE_PROVIDER_SITE_OTHER): Payer: BLUE CROSS/BLUE SHIELD | Admitting: Family Medicine

## 2015-05-17 VITALS — BP 130/83 | HR 99 | Temp 97.4°F | Ht 66.0 in | Wt 179.4 lb

## 2015-05-17 DIAGNOSIS — N3000 Acute cystitis without hematuria: Secondary | ICD-10-CM | POA: Diagnosis not present

## 2015-05-17 DIAGNOSIS — R300 Dysuria: Secondary | ICD-10-CM

## 2015-05-17 LAB — URINALYSIS, COMPLETE
Bilirubin, UA: NEGATIVE
Glucose, UA: NEGATIVE
Ketones, UA: NEGATIVE
Nitrite, UA: NEGATIVE
PH UA: 6 (ref 5.0–7.5)
PROTEIN UA: NEGATIVE
Specific Gravity, UA: 1.025 (ref 1.005–1.030)
Urobilinogen, Ur: 0.2 mg/dL (ref 0.2–1.0)

## 2015-05-17 LAB — MICROSCOPIC EXAMINATION: WBC, UA: >30 /hpf — AB (ref 0–?)

## 2015-05-17 MED ORDER — CEPHALEXIN 500 MG PO CAPS: 500.0000 mg | ORAL_CAPSULE | Freq: Four times a day (QID) | ORAL | Status: DC

## 2015-05-17 NOTE — Progress Notes (Signed)
BP 130/83 mmHg  Pulse 99  Temp(Src) 97.4 F (36.3 C) (Oral)  Ht 5\' 6"  (1.676 m)  Wt 179 lb 6.4 oz (81.375 kg)  BMI 28.97 kg/m2   Subjective:    Patient ID: Jenna Mendoza, female    DOB: 1990-12-04, 25 y.o.   MRN: [id number]  HPI: Jenna Mendoza is a 25 y.o. female presenting on 05/17/2015 for Urinary Tract Infection   HPI Dysuria Patient has been having dysuria for the past day and a half. She denies any fevers or chills or flank pain. She is currently taking half weeks pregnant. She started feeling some urinary burning and pain mostly at the introitus and not in the abdomen is much. She denies any vaginal discharge or vaginal bleeding vaginal pain. She has been trying to drink more fluids and that is been helping some but it came back again this morning.  Relevant past medical, surgical, family and social history reviewed and updated as indicated. Interim medical history since our last visit reviewed. Allergies and medications reviewed and updated.  Review of Systems  Constitutional: Negative for fever and chills.  HENT: Negative for congestion, ear discharge and ear pain.   Eyes: Negative for redness and visual disturbance.  Respiratory: Negative for chest tightness and shortness of breath.   Cardiovascular: Negative for chest pain and leg swelling.  Gastrointestinal: Negative for abdominal pain.  Genitourinary: Positive for dysuria, urgency and frequency. Negative for hematuria, decreased urine volume, vaginal bleeding, vaginal discharge, difficulty urinating and vaginal pain.  Musculoskeletal: Negative for back pain and gait problem.  Skin: Negative for rash.  Neurological: Negative for light-headedness and headaches.  Psychiatric/Behavioral: Negative for behavioral problems and agitation.  All other systems reviewed and are negative.   Per HPI unless specifically indicated above     Medication List       This list is accurate as of: 05/17/15  2:25 PM.  Always use  your most recent med list.               cephALEXin 500 MG capsule  Commonly known as:  KEFLEX  Take 1 capsule (500 mg total) by mouth 4 (four) times daily.     DICLEGIS PO  Take by mouth.     docusate sodium 100 MG capsule  Commonly known as:  COLACE  Take 100 mg by mouth 2 (two) times daily.     ondansetron 4 MG disintegrating tablet  Commonly known as:  ZOFRAN-ODT  Take 4 mg by mouth every 8 (eight) hours as needed for nausea or vomiting.     PRENATAL 28-0.8 MG Tabs  Take 1 tablet by mouth daily.     ranitidine 75 MG tablet  Commonly known as:  ZANTAC  Take 75 mg by mouth 2 (two) times daily.           Objective:    BP 130/83 mmHg  Pulse 99  Temp(Src) 97.4 F (36.3 C) (Oral)  Ht 5\' 6"  (1.676 m)  Wt 179 lb 6.4 oz (81.375 kg)  BMI 28.97 kg/m2  Wt Readings from Last 3 Encounters:  05/17/15 179 lb 6.4 oz (81.375 kg)  03/31/15 180 lb 12.8 oz (82.01 kg)  02/16/15 178 lb 3.2 oz (80.831 kg)    Physical Exam  Constitutional: She is oriented to person, place, and time. She appears well-developed and well-nourished. No distress.  Eyes: Conjunctivae and EOM are normal. Pupils are equal, round, and reactive to light.  Cardiovascular: Normal rate, regular rhythm, normal heart  sounds and intact distal pulses.   No murmur heard. Pulmonary/Chest: Effort normal and breath sounds normal. No respiratory distress. She has no wheezes.  Abdominal: Soft. Bowel sounds are normal. She exhibits no distension. There is no hepatosplenomegaly. There is no tenderness. There is no rebound and no CVA tenderness.  Musculoskeletal: Normal range of motion. She exhibits no edema or tenderness.  Neurological: She is alert and oriented to person, place, and time. Coordination normal.  Skin: Skin is warm and dry. No rash noted. She is not diaphoretic.  Psychiatric: She has a normal mood and affect. Her behavior is normal.  Nursing note and vitals reviewed.     Assessment & Plan:   Problem  List Items Addressed This Visit    None    Visit Diagnoses    Dysuria    -  Primary    Relevant Medications    cephALEXin (KEFLEX) 500 MG capsule    Other Relevant Orders    Urinalysis, Complete    Acute cystitis without hematuria        Relevant Medications    cephALEXin (KEFLEX) 500 MG capsule       Follow up plan: Return if symptoms worsen or fail to improve.  Counseling provided for all of the vaccine components Orders Placed This Encounter  Procedures  . Urinalysis, Complete    Caryl Pina, MD [facility] 05/17/2015, 2:25 PM

## 2015-05-17 NOTE — Telephone Encounter (Signed)
Patient aware she will need to be seen  

## 2015-05-20 ENCOUNTER — Encounter (INDEPENDENT_AMBULATORY_CARE_PROVIDER_SITE_OTHER): Payer: Self-pay

## 2015-05-20 ENCOUNTER — Encounter: Payer: Self-pay | Admitting: *Deleted

## 2015-05-31 ENCOUNTER — Encounter: Payer: Self-pay | Admitting: Nurse Practitioner

## 2015-05-31 ENCOUNTER — Ambulatory Visit (INDEPENDENT_AMBULATORY_CARE_PROVIDER_SITE_OTHER): Payer: BLUE CROSS/BLUE SHIELD | Admitting: Nurse Practitioner

## 2015-05-31 VITALS — BP 132/70 | HR 90 | Temp 97.2°F | Ht 66.0 in | Wt 180.0 lb

## 2015-05-31 DIAGNOSIS — R300 Dysuria: Secondary | ICD-10-CM

## 2015-05-31 DIAGNOSIS — N3000 Acute cystitis without hematuria: Secondary | ICD-10-CM | POA: Diagnosis not present

## 2015-05-31 DIAGNOSIS — Z3A13 13 weeks gestation of pregnancy: Secondary | ICD-10-CM

## 2015-05-31 LAB — MICROSCOPIC EXAMINATION
RBC MICROSCOPIC, UA: NONE SEEN /HPF (ref 0–?)
WBC, UA: >30 /hpf — AB (ref 0–?)

## 2015-05-31 LAB — URINALYSIS, COMPLETE
BILIRUBIN UA: NEGATIVE
Glucose, UA: NEGATIVE
KETONES UA: NEGATIVE
NITRITE UA: NEGATIVE
PH UA: 7 (ref 5.0–7.5)
Protein, UA: NEGATIVE
RBC UA: NEGATIVE
Specific Gravity, UA: 1.015 (ref 1.005–1.030)
UUROB: 0.2 mg/dL (ref 0.2–1.0)

## 2015-05-31 MED ORDER — AMOXICILLIN 875 MG PO TABS: 875.0000 mg | ORAL_TABLET | Freq: Two times a day (BID) | ORAL | Status: DC

## 2015-05-31 NOTE — Progress Notes (Signed)
   Subjective:    Patient ID: Jenna Mendoza, female    DOB: May 26, 1990, 25 y.o.   MRN: [id number]  HPI Patient comes in c/o urinary discomfort- she is [redacted] weeks gestation and saw Dr. Warrick Parisian on 05/17/15 and was dx with UTI and was given keflex- she says she is no better and continues to feel like she needs to void. SHe saw her OB last Thursday and she said that her uterus is inverted and it is pushing on her bladder but should get better as pregnancy continues. SHe has been vomiting a lot and has not really been able to keep antibiotic down and she thinks it may have not cleared up UTI.    Review of Systems  Constitutional: Negative.   HENT: Negative.   Respiratory: Negative.   Cardiovascular: Negative.   Gastrointestinal: Positive for nausea and vomiting. Negative for diarrhea and constipation.  Genitourinary: Positive for dysuria and urgency.  Neurological: Negative.   Psychiatric/Behavioral: Negative.   All other systems reviewed and are negative.      Objective:   Physical Exam  Constitutional: She appears well-developed and well-nourished. No distress.  Cardiovascular: Normal rate, regular rhythm and normal heart sounds.   Pulmonary/Chest: Effort normal and breath sounds normal.  Neurological: She is alert.  Skin: Skin is warm.  Psychiatric: She has a normal mood and affect. Her behavior is normal. Judgment and thought content normal.    BP 132/70 mmHg  Pulse 90  Temp(Src) 97.2 F (36.2 C) (Oral)  Ht 5\' 6"  (1.676 m)  Wt 180 lb (81.647 kg)  BMI 29.07 kg/m2      Assessment & Plan:   1. Dysuria   2. Acute cystitis without hematuria   3. [redacted] weeks gestation of pregnancy    Meds ordered this encounter  Medications  . amoxicillin (AMOXIL) 875 MG tablet    Sig: Take 1 tablet (875 mg total) by mouth 2 (two) times daily. 1 po BID    Dispense:  20 tablet    Refill:  0    Order Specific Question:  Supervising Provider    Answer:  Ricard Dillon [id number]   Take  medication as prescribe Cotton underwear Take shower not bath Cranberry juice, yogurt Force fluids AZO over the counter X2 days Culture pending RTO prn  Mary-Margaret Hassell Done, FNP

## 2015-05-31 NOTE — Patient Instructions (Signed)
Pregnancy and Urinary Tract Infection  A urinary tract infection (UTI) is a bacterial infection of the urinary tract. Infection of the urinary tract can include the ureters, kidneys (pyelonephritis), bladder (cystitis), and urethra (urethritis). All pregnant women should be screened for bacteria in the urinary tract. Identifying and treating a UTI will decrease the risk of preterm labor and developing more serious infections in both the mother and baby.  CAUSES  Bacteria germs cause almost all UTIs.   RISK FACTORS  Many factors can increase your chances of getting a UTI during pregnancy. These include:  · Having a short urethra.  · Poor toilet and hygiene habits.  · Sexual intercourse.  · Blockage of urine along the urinary tract.  · Problems with the pelvic muscles or nerves.  · Diabetes.  · Obesity.  · Bladder problems after having several children.  · Previous history of UTI.  SIGNS AND SYMPTOMS   · Pain, burning, or a stinging feeling when urinating.  · Suddenly feeling the need to urinate right away (urgency).  · Loss of bladder control (urinary incontinence).  · Frequent urination, more than is common with pregnancy.  · Lower abdominal or back discomfort.  · Cloudy urine.  · Blood in the urine (hematuria).  · Fever.   When the kidneys are infected, the symptoms may be:  · Back pain.  · Flank pain on the right side more so than the left.  · Fever.  · Chills.  · Nausea.  · Vomiting.  DIAGNOSIS   A urinary tract infection is usually diagnosed through urine tests. Additional tests and procedures are sometimes done. These may include:  · Ultrasound exam of the kidneys, ureters, bladder, and urethra.  · Looking in the bladder with a lighted tube (cystoscopy).  TREATMENT  Typically, UTIs can be treated with antibiotic medicines.   HOME CARE INSTRUCTIONS   · Only take over-the-counter or prescription medicines as directed by your health care provider. If you were prescribed antibiotics, take them as directed. Finish  them even if you start to feel better.  · Drink enough fluids to keep your urine clear or pale yellow.  · Do not have sexual intercourse until the infection is gone and your health care provider says it is okay.  · Make sure you are tested for UTIs throughout your pregnancy. These infections often come back.   Preventing a UTI in the Future  · Practice good toilet habits. Always wipe from front to back. Use the tissue only once.  · Do not hold your urine. Empty your bladder as soon as possible when the urge comes.  · Do not douche or use deodorant sprays.  · Wash with soap and warm water around the genital area and the anus.  · Empty your bladder before and after sexual intercourse.  · Wear underwear with a cotton crotch.  · Avoid caffeine and carbonated drinks. They can irritate the bladder.  · Drink cranberry juice or take cranberry pills. This may decrease the risk of getting a UTI.  · Do not drink alcohol.  · Keep all your appointments and tests as scheduled.   SEEK MEDICAL CARE IF:   · Your symptoms get worse.  · You are still having fevers 2 or more days after treatment begins.  · You have a rash.  · You feel that you are having problems with medicines prescribed.  · You have abnormal vaginal discharge.  SEEK IMMEDIATE MEDICAL CARE IF:   · You have back or flank   pain.  · You have chills.  · You have blood in your urine.  · You have nausea and vomiting.  · You have contractions of your uterus.  · You have a gush of fluid from the vagina.  MAKE SURE YOU:  · Understand these instructions.    · Will watch your condition.    · Will get help right away if you are not doing well or get worse.       This information is not intended to replace advice given to you by your health care provider. Make sure you discuss any questions you have with your health care provider.     Document Released: 04/28/2010 Document Revised: 10/22/2012 Document Reviewed: 07/31/2012  Elsevier Interactive Patient Education ©2016 Elsevier  Inc.

## 2015-06-01 LAB — URINE CULTURE: Organism ID, Bacteria: NO GROWTH

## 2015-11-16 LAB — OB RESULTS CONSOLE GBS: STREP GROUP B AG: POSITIVE

## 2015-11-20 ENCOUNTER — Encounter (HOSPITAL_COMMUNITY): Payer: Self-pay | Admitting: *Deleted

## 2015-11-20 ENCOUNTER — Inpatient Hospital Stay (HOSPITAL_COMMUNITY)
Admission: AD | Admit: 2015-11-20 | Discharge: 2015-11-20 | Disposition: A | Discharge status: Home / Self Care | Source: Ambulatory Visit | Attending: Obstetrics | Admitting: Obstetrics

## 2015-11-20 NOTE — Discharge Instructions (Signed)
Braxton Hicks Contractions °Contractions of the uterus can occur throughout pregnancy. Contractions are not always a sign that you are in labor.  °WHAT ARE BRAXTON HICKS CONTRACTIONS?  °Contractions that occur before labor are called Braxton Hicks contractions, or false labor. Toward the end of pregnancy (32-34 weeks), these contractions can develop more often and may become more forceful. This is not true labor because these contractions do not result in opening (dilatation) and thinning of the cervix. They are sometimes difficult to tell apart from true labor because these contractions can be forceful and people have different pain tolerances. You should not feel embarrassed if you go to the hospital with false labor. Sometimes, the only way to tell if you are in true labor is for your health care provider to look for changes in the cervix. °If there are no prenatal problems or other health problems associated with the pregnancy, it is completely safe to be sent home with false labor and await the onset of true labor. °HOW CAN YOU TELL THE DIFFERENCE BETWEEN TRUE AND FALSE LABOR? °False Labor °· The contractions of false labor are usually shorter and not as hard as those of true labor.   °· The contractions are usually irregular.   °· The contractions are often felt in the front of the lower abdomen and in the groin.   °· The contractions may go away when you walk around or change positions while lying down.   °· The contractions get weaker and are shorter lasting as time goes on.   °· The contractions do not usually become progressively stronger, regular, and closer together as with true labor.   °True Labor °· Contractions in true labor last 30-70 seconds, become very regular, usually become more intense, and increase in frequency.   °· The contractions do not go away with walking.   °· The discomfort is usually felt in the top of the uterus and spreads to the lower abdomen and low back.   °· True labor can be  determined by your health care provider with an exam. This will show that the cervix is dilating and getting thinner.   °WHAT TO REMEMBER °· Keep up with your usual exercises and follow other instructions given by your health care provider.   °· Take medicines as directed by your health care provider.   °· Keep your regular prenatal appointments.   °· Eat and drink lightly if you think you are going into labor.   °· If Braxton Hicks contractions are making you uncomfortable:   °¨ Change your position from lying down or resting to walking, or from walking to resting.   °¨ Sit and rest in a tub of warm water.   °¨ Drink 2-3 glasses of water. Dehydration may cause these contractions.   °¨ Do slow and deep breathing several times an hour.   °WHEN SHOULD I SEEK IMMEDIATE MEDICAL CARE? °Seek immediate medical care if: °· Your contractions become stronger, more regular, and closer together.   °· You have fluid leaking or gushing from your vagina.   °· You have a fever.   °· You pass blood-tinged mucus.   °· You have vaginal bleeding.   °· You have continuous abdominal pain.   °· You have low back pain that you never had before.   °· You feel your baby's head pushing down and causing pelvic pressure.   °· Your baby is not moving as much as it used to.   °  °This information is not intended to replace advice given to you by your health care provider. Make sure you discuss any questions you have with your health care   provider. °  °Document Released: 01/01/2005 Document Revised: 01/06/2013 Document Reviewed: 10/13/2012 °Elsevier Interactive Patient Education ©2016 Elsevier Inc. ° °

## 2015-11-21 ENCOUNTER — Encounter (HOSPITAL_COMMUNITY): Payer: Self-pay | Admitting: *Deleted

## 2015-11-21 ENCOUNTER — Inpatient Hospital Stay (HOSPITAL_COMMUNITY)
Admission: AD | Admit: 2015-11-21 | Discharge: 2015-11-22 | DRG: 775 | Disposition: A | Discharge status: Home / Self Care | Source: Ambulatory Visit | Attending: Obstetrics | Admitting: Obstetrics

## 2015-11-21 DIAGNOSIS — O99824 Streptococcus B carrier state complicating childbirth: Secondary | ICD-10-CM | POA: Diagnosis present

## 2015-11-21 DIAGNOSIS — O4202 Full-term premature rupture of membranes, onset of labor within 24 hours of rupture: Principal | ICD-10-CM | POA: Diagnosis present

## 2015-11-21 DIAGNOSIS — Z8249 Family history of ischemic heart disease and other diseases of the circulatory system: Secondary | ICD-10-CM

## 2015-11-21 DIAGNOSIS — Z3A37 37 weeks gestation of pregnancy: Secondary | ICD-10-CM

## 2015-11-21 DIAGNOSIS — Z6791 Unspecified blood type, Rh negative: Secondary | ICD-10-CM | POA: Diagnosis not present

## 2015-11-21 DIAGNOSIS — Z833 Family history of diabetes mellitus: Secondary | ICD-10-CM | POA: Diagnosis not present

## 2015-11-21 DIAGNOSIS — O26893 Other specified pregnancy related conditions, third trimester: Secondary | ICD-10-CM | POA: Diagnosis present

## 2015-11-21 LAB — CBC
HCT: 41.5 % (ref 36.0–46.0)
HEMOGLOBIN: 14.2 g/dL (ref 12.0–15.0)
MCH: 30.1 pg (ref 26.0–34.0)
MCHC: 34.2 g/dL (ref 30.0–36.0)
MCV: 87.9 fL (ref 78.0–100.0)
Platelets: 194 10*3/uL (ref 150–400)
RBC: 4.72 MIL/uL (ref 3.87–5.11)
RDW: 13.5 % (ref 11.5–15.5)
WBC: 7.6 10*3/uL (ref 4.0–10.5)

## 2015-11-21 MED ORDER — OXYTOCIN 40 UNITS IN LACTATED RINGERS INFUSION - SIMPLE MED
2.5000 [IU]/h | INTRAVENOUS | Status: DC
  Administered 2015-11-21: 2.5 [IU]/h via INTRAVENOUS
  Filled 2015-11-21: qty 1000

## 2015-11-21 MED ORDER — SENNOSIDES-DOCUSATE SODIUM 8.6-50 MG PO TABS
2.0000 | ORAL_TABLET | ORAL | Status: DC
  Administered 2015-11-21: 2 via ORAL
  Filled 2015-11-21: qty 2

## 2015-11-21 MED ORDER — OXYCODONE HCL 5 MG PO TABS: 5.0000 mg | ORAL_TABLET | ORAL | Status: DC | PRN

## 2015-11-21 MED ORDER — PANTOPRAZOLE SODIUM 40 MG PO TBEC
40.0000 mg | DELAYED_RELEASE_TABLET | Freq: Every day | ORAL | Status: DC
  Filled 2015-11-21: qty 1

## 2015-11-21 MED ORDER — LIDOCAINE HCL (PF) 1 % IJ SOLN
30.0000 mL | INTRAMUSCULAR | Status: DC | PRN
  Filled 2015-11-21: qty 30

## 2015-11-21 MED ORDER — ONDANSETRON HCL 4 MG/2ML IJ SOLN: 4.0000 mg | Freq: Four times a day (QID) | INTRAMUSCULAR | Status: DC | PRN

## 2015-11-21 MED ORDER — PRENATAL MULTIVITAMIN CH
1.0000 | ORAL_TABLET | Freq: Every day | ORAL | Status: DC
  Administered 2015-11-22: 1 via ORAL
  Filled 2015-11-21: qty 1

## 2015-11-21 MED ORDER — ACETAMINOPHEN 325 MG PO TABS: 650.0000 mg | ORAL_TABLET | ORAL | Status: DC | PRN

## 2015-11-21 MED ORDER — OXYCODONE HCL 5 MG PO TABS: 10.0000 mg | ORAL_TABLET | ORAL | Status: DC | PRN

## 2015-11-21 MED ORDER — FLEET ENEMA 7-19 GM/118ML RE ENEM: 1.0000 | ENEMA | RECTAL | Status: DC | PRN

## 2015-11-21 MED ORDER — OXYCODONE-ACETAMINOPHEN 5-325 MG PO TABS: 1.0000 | ORAL_TABLET | ORAL | Status: DC | PRN

## 2015-11-21 MED ORDER — BENZOCAINE-MENTHOL 20-0.5 % EX AERO: 1.0000 "application " | INHALATION_SPRAY | CUTANEOUS | Status: DC | PRN

## 2015-11-21 MED ORDER — ONDANSETRON HCL 4 MG/2ML IJ SOLN: 4.0000 mg | INTRAMUSCULAR | Status: DC | PRN

## 2015-11-21 MED ORDER — LACTATED RINGERS IV SOLN
INTRAVENOUS | Status: DC
  Administered 2015-11-21: 09:00:00 via INTRAVENOUS

## 2015-11-21 MED ORDER — DIBUCAINE 1 % RE OINT: 1.0000 "application " | TOPICAL_OINTMENT | RECTAL | Status: DC | PRN

## 2015-11-21 MED ORDER — ONDANSETRON HCL 4 MG PO TABS: 4.0000 mg | ORAL_TABLET | ORAL | Status: DC | PRN

## 2015-11-21 MED ORDER — SOD CITRATE-CITRIC ACID 500-334 MG/5ML PO SOLN: 30.0000 mL | ORAL | Status: DC | PRN

## 2015-11-21 MED ORDER — DIPHENHYDRAMINE HCL 25 MG PO CAPS: 25.0000 mg | ORAL_CAPSULE | Freq: Four times a day (QID) | ORAL | Status: DC | PRN

## 2015-11-21 MED ORDER — SODIUM CHLORIDE 0.9 % IV SOLN
2.0000 g | Freq: Once | INTRAVENOUS | Status: DC
  Filled 2015-11-21: qty 2000

## 2015-11-21 MED ORDER — OXYCODONE-ACETAMINOPHEN 5-325 MG PO TABS: 2.0000 | ORAL_TABLET | ORAL | Status: DC | PRN

## 2015-11-21 MED ORDER — ZOLPIDEM TARTRATE 5 MG PO TABS: 5.0000 mg | ORAL_TABLET | Freq: Every evening | ORAL | Status: DC | PRN

## 2015-11-21 MED ORDER — SIMETHICONE 80 MG PO CHEW: 80.0000 mg | CHEWABLE_TABLET | ORAL | Status: DC | PRN

## 2015-11-21 MED ORDER — IBUPROFEN 600 MG PO TABS
600.0000 mg | ORAL_TABLET | Freq: Four times a day (QID) | ORAL | Status: DC
  Administered 2015-11-21 – 2015-11-22 (×6): 600 mg via ORAL
  Filled 2015-11-21 (×6): qty 1

## 2015-11-21 MED ORDER — OXYTOCIN BOLUS FROM INFUSION
500.0000 mL | Freq: Once | INTRAVENOUS | Status: AC
  Administered 2015-11-21: 500 mL via INTRAVENOUS

## 2015-11-21 MED ORDER — LACTATED RINGERS IV SOLN: 500.0000 mL | INTRAVENOUS | Status: DC | PRN

## 2015-11-21 MED ORDER — WITCH HAZEL-GLYCERIN EX PADS: 1.0000 "application " | MEDICATED_PAD | CUTANEOUS | Status: DC | PRN

## 2015-11-21 MED ORDER — COCONUT OIL OIL
1.0000 "application " | TOPICAL_OIL | Status: DC | PRN
  Filled 2015-11-21: qty 120

## 2015-11-21 NOTE — MAU Note (Signed)
Pt to rm after back to use restroom.  Reports loss of fluid

## 2015-11-21 NOTE — MAU Note (Signed)
Pt seen in MAU last night, DC'd home.  Contractions have continued, felt gush of fluid in bed around 0730, has continued leaking with each contraction, bloody fluid.

## 2015-11-21 NOTE — Lactation Note (Signed)
This note was copied from a baby's chart. Lactation Consultation Note  Patient Name: Jenna Mendoza [id number] Date: 11/21/2015 Reason for consult: Initial assessment Baby at 8 hr of life. Upon entry baby was sleeping sts with mom. Experienced bf mom denies breast or nipple pain, voiced no concerns, and thinks baby is feeding well. Discussed LPT baby behavior, feeding frequency, supplementing, pumping, baby belly size, voids, wt loss, breast changes, and nipple care. Declined DEBP at this time. She did request and was given breast pads. She stated she can manually express and has spoon in room. Given lactation and LPT infant handouts. Aware of OP services and support group. Mom will bf q3hr on demand and f/u each bf with manual expression/spoon feeding per LPT infant volume guidelines. She will call for support as needed.     Maternal Data    Feeding Feeding Type: Breast Fed Length of feed: 45 min  LATCH Score/Interventions                      Lactation Tools Discussed/Used WIC Program: No   Consult Status Consult Status: Follow-up Date: 11/21/15 Follow-up type: In-patient    Denzil Hughes 11/21/2015, 5:29 PM

## 2015-11-21 NOTE — H&P (Signed)
OB ADMISSION/ HISTORY & PHYSICAL:  Admission Date: 11/21/2015  8:32 AM  Admit Diagnosis: 37.4 wks SROM / Active Labor   Jenna Mendoza is a 25 y.o. female presenting for SROM at 0730 this AM and frequent contractions.  Prenatal History: G3P2000   EDC : 12/08/2015, LMP Prenatal care at [facility] since [redacted] weeks gestation Primary Care Provider at [facility]: Artelia Laroche, CNM  Prenatal course complicated by (+) GBS bacteriuria  Prenatal Labs: ABO, Rh:  A NEGATIVE Antibody:  POSITVE - Anti-D / NEGATIVE in 3rd Trimester Rubella:  IMMUNE RPR:  NON-REACTIVE  HBsAg:  NEGATIVE HIV:  NON-REACTIVE  GBS:  POSITIVE GTT: NORMAL - 87 mg/dL   Medical / Surgical History :  Past medical history:  Past Medical History:  Diagnosis Date  . Allergy    shellfish  . Degenerative joint disease 2008  . Joint disorder 2008   degeneration  . Urinary tract infection during pregnancy mid december 2012     Past surgical history:  Past Surgical History:  Procedure Laterality Date  . NO PAST SURGERIES       Family History:  Family History  Problem Relation Age of Onset  . Diabetes Mellitus II Paternal Grandfather   . Hypertension Father   . Breast cancer Maternal Grandmother   . Schizophrenia Mother   . Bipolar disorder Mother   . Anesthesia problems Neg Hx   . Hypotension Neg Hx   . Malignant hyperthermia Neg Hx   . Pseudochol deficiency Neg Hx      Social History:  reports that she has never smoked. She has never used smokeless tobacco. She reports that she drinks about 0.6 oz of alcohol per week . She reports that she does not use drugs.   Allergies: Shellfish allergy    Current Medications at time of admission:  Prescriptions Prior to Admission  Medication Sig Dispense Refill Last Dose  . amoxicillin (AMOXIL) 875 MG tablet Take 1 tablet (875 mg total) by mouth 2 (two) times daily. 1 po BID 20 tablet 0 Past Week at Unknown time  . docusate sodium  (COLACE) 100 MG capsule Take 100 mg by mouth 2 (two) times daily.   Past Month at Unknown time  . Doxylamine-Pyridoxine (DICLEGIS PO) Take by mouth.   Past Month at Unknown time  . esomeprazole (NEXIUM) 20 MG capsule Take 20 mg by mouth daily at 12 noon.   11/19/2015 at Unknown time  . ondansetron (ZOFRAN-ODT) 4 MG disintegrating tablet Take 4 mg by mouth every 8 (eight) hours as needed for nausea or vomiting.   More than a month at Unknown time  . PRENATAL 28-0.8 MG TABS Take 1 tablet by mouth daily.   11/19/2015 at Unknown time  . ranitidine (ZANTAC) 75 MG tablet Take 75 mg by mouth 2 (two) times daily.   Past Month at Unknown time      Review of Systems: Active FM onset of ctx since last night currently every 1-3 minutes LOF  / SROM @ 0730 bloody show some   Physical Exam:  Today's Vitals   11/21/15 0843 11/21/15 0851  BP: 138/96   Pulse: 109   Resp: 18   Temp: 97.8 F (36.6 C)   PainSc:  8    General: alert and oriented x 3, NAD Heart: RRR, no murmurs Lungs: CTAB Abdomen: Soft and non-tender, non-distended / Uterus: Gravid, non-tender Extremities: No edema Genitalia / VE: Dilation: 9 Effacement (%): 100 Station: +1 Exam by:: J.  Lowe RN FHR: 110 bpm / moderate variability / accels present / variables with every contraction TOCO: regular every 1.5-3 mins  Labs:     Recent Labs  11/21/15 0910  WBC 7.6  HGB 14.2  HCT 41.5  PLT 194     Assessment:  25 y.o. G3P2000 at [redacted]w[redacted]d  1. Labor: Active 2. Fetal Wellbeing: Category 2  3. Pain Control: none 4. GBS: POSITIVE   Plan:  1. Admit to BS 2. Routine L&D orders 3. Ampicillin 2gm IVPB for rapid labor    Dr. Pamala Hurry notified of admission / precipitous delivery    Laury Deep, M, MSN, CNM 11/21/2015, 9:38 AM

## 2015-11-22 LAB — CBC
HCT: 36.4 % (ref 36.0–46.0)
HEMOGLOBIN: 12.3 g/dL (ref 12.0–15.0)
MCH: 29.9 pg (ref 26.0–34.0)
MCHC: 33.8 g/dL (ref 30.0–36.0)
MCV: 88.6 fL (ref 78.0–100.0)
Platelets: 175 10*3/uL (ref 150–400)
RBC: 4.11 MIL/uL (ref 3.87–5.11)
RDW: 14.1 % (ref 11.5–15.5)
WBC: 8.9 10*3/uL (ref 4.0–10.5)

## 2015-11-22 LAB — RPR: RPR Ser Ql: NONREACTIVE

## 2015-11-22 MED ORDER — IBUPROFEN 600 MG PO TABS: 600.0000 mg | ORAL_TABLET | Freq: Four times a day (QID) | ORAL | 0 refills | Status: DC

## 2015-11-22 MED ORDER — BENZOCAINE-MENTHOL 20-0.5 % EX AERO: 1.0000 "application " | INHALATION_SPRAY | CUTANEOUS | Status: DC | PRN

## 2015-11-22 MED ORDER — RHO D IMMUNE GLOBULIN 1500 UNIT/2ML IJ SOSY
300.0000 ug | PREFILLED_SYRINGE | Freq: Once | INTRAMUSCULAR | Status: AC
  Administered 2015-11-22: 300 ug via INTRAMUSCULAR
  Filled 2015-11-22: qty 2

## 2015-11-22 MED ORDER — COCONUT OIL OIL: 1.0000 "application " | TOPICAL_OIL | 0 refills | Status: DC | PRN

## 2015-11-22 NOTE — Discharge Summary (Signed)
Obstetric Discharge Summary Reason for Admission: rupture of membranes and impending delivery Prenatal Procedures: ultrasound and fetal echo Intrapartum Procedures: spontaneous vaginal delivery Postpartum Procedures: Rho(D) Ig Complications-Operative and Postpartum: none Hemoglobin  Date Value Ref Range Status  11/22/2015 12.3 12.0 - 15.0 g/dL Final   HCT  Date Value Ref Range Status  11/22/2015 36.4 36.0 - 46.0 % Final    Physical Exam:  General: alert, cooperative and no distress Lochia: appropriate Uterine Fundus: firm Incision: none DVT Evaluation: No cords or calf tenderness. No significant calf/ankle edema.  Discharge Diagnoses: Term Pregnancy-delivered  Discharge Information: Date: 11/22/2015 Activity: pelvic rest Diet: routine Medications: PNV and Ibuprofen Condition: stable Instructions: refer to practice specific booklet Discharge to: boarding status Follow-up [place], CNM. Schedule an appointment as soon as possible for a visit in 6 week(s).   Specialty:  Obstetrics and Gynecology Why:  Postpartum visit Contact information: [place] [address] [phone]           Newborn Data: Live born female "Lowella Petties" Birth Weight: 5 lb 12.2 oz (2614 g) APGAR: 9, 9  Remains inpatient for 48 hour observation.  Juliene Pina 11/22/2015, 8:54 AM

## 2015-11-22 NOTE — Discharge Instructions (Signed)
Breast Pumping Tips °If you are breastfeeding, there may be times when you cannot feed your baby directly. Returning to work or going on a trip are common examples. Pumping allows you to store breast milk and feed it to your baby later.  °You may not get much milk when you first start to pump. Your breasts should start to make more after a few days. If you pump at the times you usually feed your baby, you may be able to keep making enough milk to feed your baby without also using formula. The more often you pump, the more milk you will produce.  °WHEN SHOULD I PUMP?  °· You can begin to pump soon after delivery. However, some experts recommend waiting about 4 weeks before giving your infant a bottle to make sure breastfeeding is going well.  °· If you plan to return to work, begin pumping a few weeks before. This will help you develop techniques that work best for you. It also lets you build up a supply of breast milk.   °· When you are with your infant, feed on demand and pump after each feeding.   °· When you are away from your infant for several hours, pump for about 15 minutes every 2-3 hours. Pump both breasts at the same time if you can.   °· If your infant has a formula feeding, make sure to pump around the same time.     °· If you drink any alcohol, wait 2 hours before pumping.   °HOW DO I PREPARE TO PUMP? °Your let-down reflex is the natural reaction to stimulation that makes your breast milk flow. It is easier to stimulate this reflex when you are relaxed. Find relaxation techniques that work for you. If you have difficulty with your let-down reflex, try these methods:  °· Smell one of your infant's blankets or an item of clothing.   °· Look at a picture or video of your infant.   °· Sit in a quiet, private space.   °· Massage the breast you plan to pump.   °· Place soothing warmth on the breast.   °· Play relaxing music.   °WHAT ARE SOME GENERAL BREAST PUMPING TIPS? °· Wash your hands before you pump. You  do not need to wash your nipples or breasts. °· There are three ways to pump. °¨ You can use your hand to massage and compress your breast. °¨ You can use a handheld manual pump. °¨ You can use an electric pump.   °· Make sure the suction cup (flange) on the breast pump is the right size. Place the flange directly over the nipple. If it is the wrong size or placed the wrong way, it may be painful and cause nipple damage.   °· If pumping is uncomfortable, apply a small amount of purified or modified lanolin to your nipple and areola. °· If you are using an electric pump, adjust the speed and suction power to be more comfortable. °· If pumping is painful or if you are not getting very much milk, you may need a different type of pump. A lactation consultant can help you determine what type of pump to use.   °· Keep a full water bottle near you at all times. Drinking lots of fluid helps you make more milk.  °· You can store your milk to use later. Pumped breast milk can be stored in a sealable, sterile container or plastic bag. Label all stored breast milk with the date you pumped it. °¨ Milk can stay out at room temperature for up to 8 hours. °¨   You can store your milk in the refrigerator for up to 8 days.  You can store your milk in the freezer for 3 months. Thaw frozen milk using warm water. Do not put it in the microwave.  Do not smoke. Smoking can lower your milk supply and harm your infant. If you need help quitting, ask your health care provider to recommend a program.  [place] A LACTATION CONSULTANT?  You are having trouble pumping.  You are concerned that you are not making enough milk.  You have nipple pain, soreness, or redness.  You want to use birth control. Birth control pills may lower your milk supply. Talk to your health care provider about your options.   This information is not intended to replace advice given to you by your health care provider.  Make sure you discuss any questions you have with your health care provider.   Document Released: 06/21/2009 Document Revised: 01/06/2013 Document Reviewed: 10/24/2012 Elsevier Interactive Patient Education [employer].

## 2015-11-22 NOTE — Lactation Note (Addendum)
This note was copied from a Jenna Mendoza's chart. Lactation Consultation Note  Patient Name: [name] [id number] Date: 11/22/2015 Reason for consult: Follow-up assessment;Infant < 6lbs  Jenna Mendoza 39 hours old. Mom reports that the Jenna Mendoza has been sleepy at the breast. Discussed infant behavior, and how Jenna Mendoza probably tiring d/t GA and low weight at birth. Mom able to hand express colostrum easily. Set mom up with DEBP and EBM flowing easily. Set mom up with DEBP--mom reports that she did not have to pump at all with 2 older children, but will be returning to work with this child and will need to pump. Mom has [employer]ra at home. Mom use DEBP and collected 5 ml of EBM. Demonstrated to mom how to syringe and finger-feed Jenna Mendoza the EBM, and Jenna Mendoza tolerated well. Enc mom to pump again in 30-60 minutes in order to have EBM before the next feeding. Discussed with mom that the Jenna Mendoza may eat again fairly soon. Enc feeding with cues.  Mom has LPI with supplementation guidelines. Reviewed amounts with mom, and enc starting with 5 ml for first feeding, then increasing by 5 ml at each feeding to keep up with guideline amounts. Enc mom to keep total feeding times to 30 minutes, and to continue following guidelines until Jenna Mendoza closer to due date and over 6 pounds.   Plan is for mom to put Jenna Mendoza to breast first with cues, and at least every 3 hours--waking as needed. Enc mom to supplement with EBM/formula according to guidelines. Enc mom to post-pump after each feeding in order to protect her supply and have EBM for supplementing.   Maternal Data    Feeding Feeding Type: Breast Fed Length of feed:  (Jenna Mendoza licked and latched off-and-on, but really not latching.)  LATCH Score/Interventions Latch: Too sleepy or reluctant, no latch achieved, no sucking elicited. Intervention(s): Skin to skin  Audible Swallowing: None  Type of Nipple: Everted at rest and after stimulation  Comfort (Breast/Nipple): Soft / non-tender      Hold (Positioning): Assistance needed to correctly position infant at breast and maintain latch. Intervention(s): Breastfeeding basics reviewed;Support Pillows;Position options;Skin to skin  LATCH Score: 5  Lactation Tools Discussed/Used Tools: Pump Breast pump type: Double-Electric Breast Pump Pump Review: Setup, frequency, and cleaning;Milk Storage Initiated by:: JW Date initiated:: 11/22/15   Consult Status Consult Status: Follow-up Date: 11/23/15 Follow-up type: In-patient    Andres Labrum 11/22/2015, 2:32 PM

## 2015-11-22 NOTE — Progress Notes (Signed)
PPD # 1 SVD Information for the patient's newborn:  Jenna Mendoza, Jenna Mendoza [id number]  female    breast feeding, working on latch, baby small and small bite, requesting [place] support Baby name: [place] "Lowella Petties"  S:  Reports feeling well, desires DC to boarding.             Tolerating po/ No nausea or vomiting             Bleeding is light             Pain controlled with ibuprofen (OTC)             Up ad lib / ambulatory / voiding without difficulties        O:  A & O x 3, in no apparent distress              VS:  Vitals:   11/21/15 1130 11/21/15 1258 11/21/15 1655 11/22/15 0541  BP: 120/71 119/60 (!) 111/57 (!) 113/51  Pulse: 64 67 83 62  Resp: 20 20 20 18   Temp: 98.7 F (37.1 C) 98 F (36.7 C) 98.7 F (37.1 C) 97.9 F (36.6 C)  TempSrc: Oral Oral Oral Oral  Weight:      Height:        LABS:  Recent Labs  11/21/15 0910 11/22/15 0544  WBC 7.6 8.9  HGB 14.2 12.3  HCT 41.5 36.4  PLT 194 175    Blood type: --/--/A NEG (11/06 0910)- Infant Rh positive  Rubella:   immune  I&O: I/O last 3 completed shifts: In: -  Out: 50 [Blood:50]          No intake/output data recorded.  Lungs: Clear and unlabored  Heart: regular rate and rhythm / no murmurs  Abdomen: soft, non-tender, non-distended             Fundus: firm, non-tender, U-1  Perineum: no edema, intact  Lochia: small  Extremities: no edema, no calf pain or tenderness    A/P: PPD # 1 25 y.o., [id number]  Principal Problem:   Postpartum care following vaginal delivery (11/6) Rh neg with Rh pos infant  - Rhophylac per protocol  Doing well - stable status  Routine post partum orders  DC to boarding status, infant remains inpatient obs for GBS w/o prophylaxis 2/2 rapid birth    Juliene Pina, MSN, CNM 11/22/2015, 8:38 AM

## 2015-11-23 ENCOUNTER — Ambulatory Visit: Payer: Self-pay

## 2015-11-23 LAB — RH IG WORKUP (INCLUDES ABO/RH)
ABO/RH(D): A NEG
Fetal Screen: NEGATIVE
GESTATIONAL AGE(WKS): 37
Unit division: 0

## 2015-11-23 LAB — TYPE AND SCREEN
ABO/RH(D): A NEG
Antibody Screen: POSITIVE
DAT, IgG: NEGATIVE
UNIT DIVISION: 0
UNIT DIVISION: 0

## 2015-11-23 NOTE — Lactation Note (Signed)
This note was copied from a baby's chart. Lactation Consultation Note  Patient Name: Jenna Mendoza [id number] Date: 11/23/2015 Reason for consult: Follow-up assessment;Infant < 6lbs Follow up visit made.  Mom states baby is waking up and feeding better.  Breasts are full this AM but not engorged.  Observed mom easily latch baby to breast using cradle hold.  Baby nursed actively with audible swallows.  Reviewed waking techniques and breast massage. Discharge teaching done including engorgement treatment.  Questions answered.  Lactation outpatient services and support information reviewed and encouraged.  Maternal Data    Feeding Feeding Type: Breast Fed Length of feed: 10 min  LATCH Score/Interventions Latch: Grasps breast easily, tongue down, lips flanged, rhythmical sucking. Intervention(s): Teach feeding cues;Waking techniques  Audible Swallowing: Spontaneous and intermittent  Type of Nipple: Everted at rest and after stimulation  Comfort (Breast/Nipple): Soft / non-tender     Hold (Positioning): No assistance needed to correctly position infant at breast. Intervention(s): Breastfeeding basics reviewed;Support Pillows;Position options  LATCH Score: 10  Lactation Tools Discussed/Used     Consult Status Consult Status: Complete    Ave Filter 11/23/2015, 10:39 AM

## 2015-12-14 ENCOUNTER — Encounter: Payer: Self-pay | Admitting: Family Medicine

## 2015-12-14 ENCOUNTER — Ambulatory Visit (INDEPENDENT_AMBULATORY_CARE_PROVIDER_SITE_OTHER): Admitting: Family Medicine

## 2015-12-14 ENCOUNTER — Ambulatory Visit: Payer: BLUE CROSS/BLUE SHIELD | Admitting: Family Medicine

## 2015-12-14 VITALS — BP 132/79 | HR 106 | Temp 97.1°F | Ht 66.0 in | Wt 178.1 lb

## 2015-12-14 DIAGNOSIS — N3000 Acute cystitis without hematuria: Secondary | ICD-10-CM

## 2015-12-14 LAB — URINALYSIS, COMPLETE
BILIRUBIN UA: NEGATIVE
GLUCOSE, UA: NEGATIVE
KETONES UA: NEGATIVE
Nitrite, UA: NEGATIVE
SPEC GRAV UA: 1.01 (ref 1.005–1.030)
Urobilinogen, Ur: 0.2 mg/dL (ref 0.2–1.0)
pH, UA: 5 (ref 5.0–7.5)

## 2015-12-14 LAB — MICROSCOPIC EXAMINATION: RENAL EPITHEL UA: NONE SEEN /HPF

## 2015-12-14 MED ORDER — CEPHALEXIN 500 MG PO CAPS: 500.0000 mg | ORAL_CAPSULE | Freq: Four times a day (QID) | ORAL | 0 refills | Status: DC

## 2015-12-14 NOTE — Progress Notes (Signed)
BP 132/79   Pulse (!) 106   Temp 97.1 F (36.2 C) (Oral)   Ht 5\' 6"  (1.676 m)   Wt 178 lb 2 oz (80.8 kg)   LMP 02/27/2015 (Approximate)   BMI 28.75 kg/m    Subjective:    Patient ID: Jenna Mendoza, female    DOB: [birth year], 25 y.o.   MRN: [id number]  HPI: Jenna Mendoza is a 25 y.o. female presenting on 12/14/2015 for Urinary Tract Infection (urinary burning and pressure x 2 days)   HPI Dysuria and frequency Patient has been having dysuria and urinary frequency and abdominal pressure and is been going on for the past 2 days. She denies any fevers or chills or flank pain. She has had a couple of UTIs during her pregnancy. She just delivered her child 3 weeks ago. She delivered her vaginally without complication. She denies any blood in her urine  Relevant past medical, surgical, family and social history reviewed and updated as indicated. Interim medical history since our last visit reviewed. Allergies and medications reviewed and updated.  Review of Systems  Constitutional: Negative for chills and fever.  Respiratory: Negative for chest tightness and shortness of breath.   Cardiovascular: Negative for chest pain and leg swelling.  Gastrointestinal: Positive for abdominal pain. Negative for abdominal distention, constipation, diarrhea, nausea and vomiting.  Genitourinary: Positive for frequency and vaginal bleeding (Still having postpartum spotting). Negative for difficulty urinating, dysuria, flank pain, hematuria, pelvic pain, vaginal discharge and vaginal pain.  Musculoskeletal: Negative for back pain and gait problem.  Skin: Negative for rash.  Neurological: Negative for light-headedness and headaches.  Psychiatric/Behavioral: Negative for agitation and behavioral problems.  All other systems reviewed and are negative.   Per HPI unless specifically indicated above     Medication List       Accurate as of 12/14/15 11:26 AM. Always use your most recent med list.            cephALEXin 500 MG capsule Commonly known as:  KEFLEX Take 1 capsule (500 mg total) by mouth 4 (four) times daily.   ibuprofen 600 MG tablet Commonly known as:  ADVIL,MOTRIN Take 1 tablet (600 mg total) by mouth every 6 (six) hours.   PRENATAL 28-0.8 MG Tabs Take 1 tablet by mouth daily.          Objective:    BP 132/79   Pulse (!) 106   Temp 97.1 F (36.2 C) (Oral)   Ht 5\' 6"  (1.676 m)   Wt 178 lb 2 oz (80.8 kg)   LMP 02/27/2015 (Approximate)   BMI 28.75 kg/m   Wt Readings from Last 3 Encounters:  12/14/15 178 lb 2 oz (80.8 kg)  11/21/15 190 lb (86.2 kg)  11/20/15 190 lb (86.2 kg)    Physical Exam  Constitutional: She appears well-developed and well-nourished. No distress.  Eyes: Conjunctivae are normal.  Abdominal: Soft. There is no tenderness (No tenderness on exam, no CVA tenderness).  Patient breast-feeding, difficult to assess any further  Skin: She is not diaphoretic.  Psychiatric: She has a normal mood and affect. Her behavior is normal.  Nursing note and vitals reviewed.  Urinalysis: 11-30 WBCs, 0-2 RBCs, 0-10 epithelial cells, few bacteria, 3+ leukocytes, 1+ protein, trace blood    Assessment & Plan:   Problem List Items Addressed This Visit    None    Visit Diagnoses    Acute cystitis without hematuria    -  Primary   Relevant Medications  cephALEXin (KEFLEX) 500 MG capsule   Other Relevant Orders   Urinalysis, Complete       Follow up plan: Return if symptoms worsen or fail to improve.  Counseling provided for all of the vaccine components Orders Placed This Encounter  Procedures  . Urinalysis, Complete    Caryl Pina, MD [facility] 12/14/2015, 11:26 AM

## 2016-11-08 ENCOUNTER — Ambulatory Visit (INDEPENDENT_AMBULATORY_CARE_PROVIDER_SITE_OTHER): Admitting: Family Medicine

## 2016-11-08 ENCOUNTER — Telehealth: Payer: Self-pay | Admitting: Family Medicine

## 2016-11-08 ENCOUNTER — Encounter: Payer: Self-pay | Admitting: Family Medicine

## 2016-11-08 VITALS — BP 114/69 | HR 84 | Temp 97.3°F | Ht 72.0 in | Wt 209.0 lb

## 2016-11-08 DIAGNOSIS — Z23 Encounter for immunization: Secondary | ICD-10-CM

## 2016-11-08 DIAGNOSIS — Z3009 Encounter for other general counseling and advice on contraception: Secondary | ICD-10-CM | POA: Diagnosis not present

## 2016-11-08 DIAGNOSIS — F53 Postpartum depression: Secondary | ICD-10-CM

## 2016-11-08 DIAGNOSIS — O99345 Other mental disorders complicating the puerperium: Secondary | ICD-10-CM

## 2016-11-08 DIAGNOSIS — N3001 Acute cystitis with hematuria: Secondary | ICD-10-CM

## 2016-11-08 LAB — URINALYSIS, COMPLETE
Bilirubin, UA: NEGATIVE
Glucose, UA: NEGATIVE
Ketones, UA: NEGATIVE
Nitrite, UA: NEGATIVE
Specific Gravity, UA: >1.03 — ABNORMAL HIGH (ref 1.005–1.030)
Urobilinogen, Ur: 0.2 mg/dL (ref 0.2–1.0)
pH, UA: 5 (ref 5.0–7.5)

## 2016-11-08 LAB — MICROSCOPIC EXAMINATION
RBC, UA: >30 /hpf — AB (ref 0–?)
Renal Epithel, UA: NONE SEEN /hpf

## 2016-11-08 MED ORDER — BUPROPION HCL ER (XL) 150 MG PO TB24: 150.0000 mg | ORAL_TABLET | Freq: Every day | ORAL | 1 refills | Status: DC

## 2016-11-08 MED ORDER — CEPHALEXIN 500 MG PO CAPS: 500.0000 mg | ORAL_CAPSULE | Freq: Four times a day (QID) | ORAL | 0 refills | Status: DC

## 2016-11-08 NOTE — Telephone Encounter (Signed)
appt scheduled Pt notified 

## 2016-11-08 NOTE — Progress Notes (Signed)
BP 114/69   Pulse 84   Temp (!) 97.3 F (36.3 C) (Oral)   Ht 6' (1.829 m)   Wt 209 lb (94.8 kg)   BMI 28.35 kg/m    Subjective:    Patient ID: Jenna Mendoza, female    DOB: 09-27-1990, 26 y.o.   MRN: [id number]  HPI: Jenna Mendoza is a 26 y.o. female presenting on 11/08/2016 for Dysuria (pt here today for UTI )   HPI Dysuria Patient comes in today complaining of dysuria that has been bothering her for the past 1.5 days.  She says she has urinary frequency and had some lower abdominal pain but that has resolved and denies any flank pain or fevers or chills.  She has gotten these once before and had one really severe so she wanted to get in before it got that bad.  Postpartum depression and irritability Patient has been having a lot of postpartum depression and irritability since her child was born almost 1 year ago.  She says that she has been dealing with this for some time and talked to her OB/GYN doctor about this and they referred her to counseling but she could not go because of the timing and taking care of the child and work.  She says that it really has not improved and she would like to discuss possible medications.  She denies any suicidal ideations or thoughts of hurting herself. Depression screen [facility] 2/9 11/08/2016 12/14/2015 05/17/2015 02/16/2015 11/19/2014  Decreased Interest 0 0 0 0 0  Down, Depressed, Hopeless 0 0 0 0 0  PHQ - 2 Score 0 0 0 0 0    Birth control counseling Patient has stopped breast-feeding within the past couple weeks and has been on the minipill but now she is just having spotting breaking through all of the time and she would like to talk other options.  She said she had an IUD Mirena before and she would like to try that again.  Relevant past medical, surgical, family and social history reviewed and updated as indicated. Interim medical history since our last visit reviewed. Allergies and medications reviewed and updated.  Review of Systems    Constitutional: Negative for chills and fever.  HENT: Negative for congestion, ear discharge and ear pain.   Eyes: Negative for redness and visual disturbance.  Respiratory: Negative for chest tightness and shortness of breath.   Cardiovascular: Negative for chest pain and leg swelling.  Gastrointestinal: Negative for abdominal pain.  Genitourinary: Positive for dysuria, frequency and urgency. Negative for decreased urine volume, difficulty urinating, flank pain, vaginal bleeding, vaginal discharge and vaginal pain.  Musculoskeletal: Negative for back pain and gait problem.  Skin: Negative for rash.  Neurological: Negative for light-headedness and headaches.  Psychiatric/Behavioral: Positive for decreased concentration and dysphoric mood. Negative for agitation, behavioral problems, self-injury, sleep disturbance and suicidal ideas. The patient is nervous/anxious.   All other systems reviewed and are negative.   Per HPI unless specifically indicated above   Allergies as of 11/08/2016      Reactions   Shellfish Allergy Swelling      Medication List       Accurate as of 11/08/16  8:59 AM. Always use your most recent med list.          buPROPion 150 MG 24 hr tablet Commonly known as:  WELLBUTRIN XL Take 1 tablet (150 mg total) by mouth daily.   cephALEXin 500 MG capsule Commonly known as:  KEFLEX Take 1  capsule (500 mg total) by mouth 4 (four) times daily.   norethindrone 0.35 MG tablet Commonly known as:  MICRONOR,CAMILA,ERRIN Take 1 tablet by mouth daily.          Objective:    BP 114/69   Pulse 84   Temp (!) 97.3 F (36.3 C) (Oral)   Ht 6' (1.829 m)   Wt 209 lb (94.8 kg)   BMI 28.35 kg/m   Wt Readings from Last 3 Encounters:  11/08/16 209 lb (94.8 kg)  12/14/15 178 lb 2 oz (80.8 kg)  11/21/15 190 lb (86.2 kg)    Physical Exam  Constitutional: She is oriented to person, place, and time. She appears well-developed and well-nourished. No distress.  Eyes:  Conjunctivae are normal.  Neck: Neck supple. No thyromegaly present.  Cardiovascular: Normal rate, regular rhythm, normal heart sounds and intact distal pulses.   No murmur heard. Pulmonary/Chest: Effort normal and breath sounds normal. No respiratory distress. She has no wheezes. She has no rales.  Abdominal: Soft. Bowel sounds are normal. She exhibits no distension. There is no tenderness. There is no rebound and no guarding.  Musculoskeletal: Normal range of motion.  Lymphadenopathy:    She has no cervical adenopathy.  Neurological: She is alert and oriented to person, place, and time. Coordination normal.  Skin: Skin is warm and dry. No rash noted. She is not diaphoretic.  Psychiatric: Her behavior is normal. Her mood appears anxious. She exhibits a depressed mood. She expresses no suicidal ideation. She expresses no suicidal plans.  Nursing note and vitals reviewed.     Assessment & Plan:   Problem List Items Addressed This Visit    None    Visit Diagnoses    Acute cystitis with hematuria    -  Primary   Relevant Medications   cephALEXin (KEFLEX) 500 MG capsule   Other Relevant Orders   Urinalysis, Complete (Completed)   Urine Culture   Postpartum depression       Relevant Medications   buPROPion (WELLBUTRIN XL) 150 MG 24 hr tablet   Birth control counseling       Patient wants to switch from the minipill to an IUD   Need for immunization against influenza       Relevant Orders   Flu Vaccine QUAD 36+ mos IM (Completed)       Follow up plan: Return in about 4 weeks (around 12/06/2016), or if symptoms worsen or fail to improve, for Pap smear and IUD insertion, scheduled for 60 minutes.  Counseling provided for all of the vaccine components Orders Placed This Encounter  Procedures  . Urinalysis, Complete    Caryl Pina, MD [facility] 11/08/2016, 8:59 AM

## 2016-11-10 LAB — URINE CULTURE

## 2016-12-19 ENCOUNTER — Ambulatory Visit (INDEPENDENT_AMBULATORY_CARE_PROVIDER_SITE_OTHER): Admitting: Family Medicine

## 2016-12-19 ENCOUNTER — Encounter: Payer: Self-pay | Admitting: Family Medicine

## 2016-12-19 VITALS — BP 135/87 | HR 100 | Temp 97.4°F | Ht 72.0 in | Wt 206.0 lb

## 2016-12-19 DIAGNOSIS — O99345 Other mental disorders complicating the puerperium: Secondary | ICD-10-CM

## 2016-12-19 DIAGNOSIS — F53 Postpartum depression: Secondary | ICD-10-CM

## 2016-12-19 DIAGNOSIS — Z30430 Encounter for insertion of intrauterine contraceptive device: Secondary | ICD-10-CM

## 2016-12-19 LAB — PREGNANCY, URINE: PREG TEST UR: NEGATIVE

## 2016-12-19 MED ORDER — LEVONORGESTREL 20 MCG/24HR IU IUD: 1.0000 | INTRAUTERINE_SYSTEM | Freq: Once | INTRAUTERINE | 0 refills | Status: DC

## 2016-12-19 MED ORDER — BUPROPION HCL ER (XL) 300 MG PO TB24: 300.0000 mg | ORAL_TABLET | Freq: Every day | ORAL | 2 refills | Status: DC

## 2016-12-19 NOTE — Progress Notes (Signed)
BP 135/87   Pulse 100   Temp (!) 97.4 F (36.3 C) (Oral)   Ht 6' (1.829 m)   Wt 206 lb (93.4 kg)   BMI 27.94 kg/m    Subjective:    Patient ID: Jenna Mendoza, female    DOB: [birth year], 26 y.o.   MRN: [id number]  HPI: Jenna Mendoza is a 26 y.o. female presenting on 12/19/2016 for IUD insertion   HPI Depression, postpartum She is coming in for recheck of postpartum depression.  She says she is feeling a lot better on the Wellbutrin and she likes the fact that it does not increase her appetite.  She still says she gets anxious sometimes and is irritable but it has been much improved.  She still is dealing with a lot with a 54-year-old child and trying to finish school.  She denies any suicidal ideations or thoughts of hurting herself.  Patient is coming in for IUD insertion.  She had one previously and we will reinsert 1 today.  She does not currently have one and has been taking the minipill while breast-feeding  Relevant past medical, surgical, family and social history reviewed and updated as indicated. Interim medical history since our last visit reviewed. Allergies and medications reviewed and updated.  Review of Systems  Constitutional: Negative for chills and fever.  Eyes: Negative for visual disturbance.  Respiratory: Negative for chest tightness and shortness of breath.   Cardiovascular: Negative for chest pain and leg swelling.  Genitourinary: Negative for menstrual problem.  Musculoskeletal: Negative for back pain and gait problem.  Skin: Negative for rash.  Neurological: Negative for light-headedness and headaches.  Psychiatric/Behavioral: Negative for agitation, behavioral problems, decreased concentration, dysphoric mood, self-injury, sleep disturbance and suicidal ideas. The patient is nervous/anxious.   All other systems reviewed and are negative.   Per HPI unless specifically indicated above        Objective:    BP 135/87   Pulse 100   Temp (!)  97.4 F (36.3 C) (Oral)   Ht 6' (1.829 m)   Wt 206 lb (93.4 kg)   BMI 27.94 kg/m   Wt Readings from Last 3 Encounters:  12/19/16 206 lb (93.4 kg)  11/08/16 209 lb (94.8 kg)  12/14/15 178 lb 2 oz (80.8 kg)    Physical Exam  Constitutional: She is oriented to person, place, and time. She appears well-developed and well-nourished. No distress.  Eyes: Conjunctivae are normal.  Genitourinary: Uterus normal. There is no rash, tenderness or lesion on the right labia. There is no rash, tenderness or lesion on the left labia. Uterus is not deviated, not enlarged, not fixed and not tender. Cervix exhibits no motion tenderness, no discharge and no friability. Right adnexum displays no mass and no tenderness. Left adnexum displays no mass and no tenderness. No erythema or tenderness in the vagina. No signs of injury around the vagina. No vaginal discharge found.  Musculoskeletal: Normal range of motion.  Neurological: She is alert and oriented to person, place, and time. Coordination normal.  Skin: Skin is warm and dry. No rash noted. She is not diaphoretic.  Psychiatric: Her behavior is normal. Judgment normal. Her mood appears anxious. She does not exhibit a depressed mood. She expresses no suicidal ideation. She expresses no suicidal plans.  Nursing note and vitals reviewed.  Urine pregnancy negative  IUD insertion procedure: Patient was placed in stirrups and use a speculum. Patient was prepped with Betadine swabs using cotton balls. Tenaculum was used  to grab the anterior cervix. Uterine sound was performed and found to be 8.5 cm in length and anteroverted. Cervical dilation was not needed. Mirena was placed using factory device at the correct depth that was measured and deployed without issue. The strings were cut leaving 1.5 cm extra. Patient tolerated procedure well and bleeding was minimal.     Assessment & Plan:   Problem List Items Addressed This Visit      Other   Postpartum depression     Relevant Medications   buPROPion (WELLBUTRIN XL) 300 MG 24 hr tablet   Encounter for IUD insertion - Primary   Relevant Medications   levonorgestrel (MIRENA, 52 MG,) 20 MCG/24HR IUD   Other Relevant Orders   Pregnancy, urine       Follow up plan: Return if symptoms worsen or fail to improve.  Counseling provided for all of the vaccine components Orders Placed This Encounter  Procedures  . Pregnancy, urine    Caryl Pina, MD [facility] 12/19/2016, 9:17 AM

## 2016-12-19 NOTE — Patient Instructions (Signed)

## 2016-12-27 ENCOUNTER — Other Ambulatory Visit: Payer: Self-pay | Admitting: Family Medicine

## 2016-12-27 DIAGNOSIS — O99345 Other mental disorders complicating the puerperium: Principal | ICD-10-CM

## 2016-12-27 DIAGNOSIS — F53 Postpartum depression: Secondary | ICD-10-CM

## 2017-01-11 ENCOUNTER — Encounter: Payer: Self-pay | Admitting: Family Medicine

## 2017-01-11 ENCOUNTER — Ambulatory Visit (INDEPENDENT_AMBULATORY_CARE_PROVIDER_SITE_OTHER): Admitting: Family Medicine

## 2017-01-11 ENCOUNTER — Ambulatory Visit: Admitting: Family Medicine

## 2017-01-11 VITALS — BP 132/81 | HR 97 | Temp 97.8°F | Ht 66.0 in | Wt 201.0 lb

## 2017-01-11 DIAGNOSIS — Z30430 Encounter for insertion of intrauterine contraceptive device: Secondary | ICD-10-CM

## 2017-01-11 MED ORDER — LEVONORGESTREL 20 MCG/24HR IU IUD: 1.0000 | INTRAUTERINE_SYSTEM | Freq: Once | INTRAUTERINE | 0 refills | Status: DC

## 2017-01-11 NOTE — Progress Notes (Signed)
BP 132/81   Pulse 97   Temp 97.8 F (36.6 C) (Oral)   Ht 5\' 6"  (1.676 m)   Wt 201 lb (91.2 kg)   BMI 32.44 kg/m    Subjective:    Patient ID: Jenna Mendoza, female    DOB: 1990-07-27, 26 y.o.   MRN: [id number]  HPI: Jenna Mendoza is a 26 y.o. female presenting on 01/11/2017 for IUD removal (feels like IUD is coming out, cramping, bleeding (bright red), clotting, feels a pinching feeling)   HPI IUD coming out and cramping and bleeding Patient is coming in because her IUD is coming out and she started cramping and having some bleeding since yesterday.  She has had IUDs in the past before her previous child but none since.  She was happy with the IUD and how it was working for and she was not even having any major bleeding but then she had this happened just yesterday.  She does admit she was having intercourse the day prior to this happening but not the day of.  Relevant past medical, surgical, family and social history reviewed and updated as indicated. Interim medical history since our last visit reviewed. Allergies and medications reviewed and updated.  Review of Systems  Constitutional: Negative for chills and fever.  Eyes: Negative for visual disturbance.  Genitourinary: Positive for menstrual problem, vaginal bleeding and vaginal pain.  Skin: Negative for rash.  Psychiatric/Behavioral: Negative for agitation and behavioral problems.  All other systems reviewed and are negative.   Per HPI unless specifically indicated above        Objective:    BP 132/81   Pulse 97   Temp 97.8 F (36.6 C) (Oral)   Ht 5\' 6"  (1.676 m)   Wt 201 lb (91.2 kg)   BMI 32.44 kg/m   Wt Readings from Last 3 Encounters:  01/11/17 201 lb (91.2 kg)  12/19/16 206 lb (93.4 kg)  11/08/16 209 lb (94.8 kg)    Physical Exam  Constitutional: She is oriented to person, place, and time. She appears well-developed and well-nourished. No distress.  Eyes: Conjunctivae are normal.    Cardiovascular: Regular rhythm.  Abdominal: Soft. Bowel sounds are normal. She exhibits no distension. There is no tenderness. There is no rebound.  Genitourinary: There is no tenderness or lesion on the right labia. There is no tenderness or lesion on the left labia. Uterus is deviated (Slightly deviated to right). Uterus is not enlarged and not tender. Cervix exhibits no discharge (IUD is halfway out the cervix). Right adnexum displays no mass and no tenderness. Left adnexum displays no mass and no tenderness.  There is a foreign body (IUD and strings are sticking halfway out the cervix) in the vagina. No vaginal discharge found.  Neurological: She is alert and oriented to person, place, and time.  Skin: Skin is warm and dry. No rash noted. She is not diaphoretic.  Psychiatric: She has a normal mood and affect. Her behavior is normal.  Nursing note and vitals reviewed. Removed the rest of the IUD that was already partially out  IUD insertion procedure: Patient was placed in stirrups and use a speculum. Patient was prepped with Betadine swabs using cotton balls. Tenaculum was used to grab the anterior cervix. Uterine sound was performed and found to be 7.5 cm in length and slightly deviated to the right. Cervical dilation was not needed. Mirena was placed using factory device at the correct depth that was measured and deployed without issue.  The strings were cut leaving 1.5 cm extra. Patient tolerated procedure well and bleeding was minimal.    Assessment & Plan:   Problem List Items Addressed This Visit      Other   Encounter for IUD insertion - Primary   Relevant Medications   levonorgestrel (MIRENA, 52 MG,) 20 MCG/24HR IUD       Follow up plan: Return if symptoms worsen or fail to improve.  Counseling provided for all of the vaccine components No orders of the defined types were placed in this encounter.   Caryl Pina, MD [facility] 01/11/2017, 1:50  PM

## 2017-01-30 ENCOUNTER — Ambulatory Visit: Admitting: Family Medicine

## 2017-04-04 ENCOUNTER — Encounter: Payer: Self-pay | Admitting: Family Medicine

## 2017-04-04 ENCOUNTER — Ambulatory Visit (INDEPENDENT_AMBULATORY_CARE_PROVIDER_SITE_OTHER): Admitting: Family Medicine

## 2017-04-04 VITALS — BP 130/84 | HR 94 | Temp 97.1°F | Ht 66.0 in | Wt 201.0 lb

## 2017-04-04 DIAGNOSIS — F53 Postpartum depression: Secondary | ICD-10-CM

## 2017-04-04 DIAGNOSIS — L608 Other nail disorders: Secondary | ICD-10-CM | POA: Diagnosis not present

## 2017-04-04 DIAGNOSIS — N939 Abnormal uterine and vaginal bleeding, unspecified: Secondary | ICD-10-CM

## 2017-04-04 DIAGNOSIS — O99345 Other mental disorders complicating the puerperium: Secondary | ICD-10-CM

## 2017-04-04 DIAGNOSIS — M7551 Bursitis of right shoulder: Secondary | ICD-10-CM | POA: Diagnosis not present

## 2017-04-04 MED ORDER — ESTRADIOL 1 MG PO TABS: 1.0000 mg | ORAL_TABLET | Freq: Every day | ORAL | 0 refills | Status: DC

## 2017-04-04 MED ORDER — BUPROPION HCL ER (XL) 300 MG PO TB24: 300.0000 mg | ORAL_TABLET | Freq: Every day | ORAL | 2 refills | Status: DC

## 2017-04-04 MED ORDER — METHYLPREDNISOLONE ACETATE 80 MG/ML IJ SUSP
80.0000 mg | Freq: Once | INTRAMUSCULAR | Status: AC
  Administered 2017-04-04: 80 mg via INTRAMUSCULAR

## 2017-04-04 NOTE — Progress Notes (Addendum)
BP 130/84   Pulse 94   Temp (!) 97.1 F (36.2 C) (Oral)   Ht 5\' 6"  (1.676 m)   Wt 201 lb (91.2 kg)   BMI 32.44 kg/m    Subjective:    Patient ID: Jenna Mendoza, female    DOB: 12-19-1990, 27 y.o.   MRN: [id number]  HPI: Jenna Mendoza is a 27 y.o. female presenting on 04/04/2017 for Right shoulder pain   HPI Shoulder pain Patient has right shoulder pain like she had a couple years ago and it came back just recently, she thinks a lot of it is due to carrying her infant around has irritated that shoulder.  It is worse with reaching behind her and overhead and holding up in front of her for prolonged periods of time.  She denies any fevers or chills or numbness or weakness.  It just hurts on the lateral aspect of the right shoulder  Splitting and color line in fingernail Patient has noticed a splinting in her right index finger of the nail and a line of color in the right index finger nail that has been there for some time now, she cannot recall exactly how long it has been there.  She denies any pain from it but was more concerned about the discoloration.  Patient needs a refill of Wellbutrin for her postpartum depression, she feels like she is doing okay and wants to continue on it for now.  Relevant past medical, surgical, family and social history reviewed and updated as indicated. Interim medical history since our last visit reviewed. Allergies and medications reviewed and updated.  Review of Systems  Constitutional: Negative for chills and fever.  Eyes: Negative for visual disturbance.  Respiratory: Negative for chest tightness and shortness of breath.   Cardiovascular: Negative for chest pain and leg swelling.  Genitourinary: Negative for difficulty urinating and dysuria.  Musculoskeletal: Positive for arthralgias. Negative for back pain and gait problem.  Skin: Positive for color change. Negative for rash.  Neurological: Negative for light-headedness and headaches.    Psychiatric/Behavioral: Negative for agitation and behavioral problems.  All other systems reviewed and are negative.   Per HPI unless specifically indicated above   Allergies as of 04/04/2017      Reactions   Shellfish Allergy Swelling      Medication List        Accurate as of 04/04/17  2:49 PM. Always use your most recent med list.          buPROPion 300 MG 24 hr tablet Commonly known as:  WELLBUTRIN XL Take 1 tablet (300 mg total) by mouth daily.   estradiol 1 MG tablet Commonly known as:  ESTRACE Take 1 tablet (1 mg total) by mouth daily.   MIRENA (52 MG) 20 MCG/24HR IUD Generic drug:  levonorgestrel 1 each by Intrauterine route once.   levonorgestrel 20 MCG/24HR IUD Commonly known as:  MIRENA (52 MG) 1 Intra Uterine Device (1 each total) by Intrauterine route once for 1 dose. Inserted on 01/11/2017          Objective:    BP 130/84   Pulse 94   Temp (!) 97.1 F (36.2 C) (Oral)   Ht 5\' 6"  (1.676 m)   Wt 201 lb (91.2 kg)   BMI 32.44 kg/m   Wt Readings from Last 3 Encounters:  04/04/17 201 lb (91.2 kg)  01/11/17 201 lb (91.2 kg)  12/19/16 206 lb (93.4 kg)    Physical Exam  Constitutional:  She is oriented to person, place, and time. She appears well-developed and well-nourished. No distress.  Eyes: Conjunctivae are normal.  Neck: Neck supple. No thyromegaly present.  Cardiovascular: Normal rate, regular rhythm, normal heart sounds and intact distal pulses.  No murmur heard. Pulmonary/Chest: Effort normal and breath sounds normal. No respiratory distress. She has no wheezes. She has no rales.  Musculoskeletal: Normal range of motion. She exhibits tenderness (Right lateral shoulder tenderness, worse with overhead range of motion and positive Hawkins impingement negative rotator cuff testing). She exhibits no edema.  Lymphadenopathy:    She has no cervical adenopathy.  Neurological: She is alert and oriented to person, place, and time. Coordination  normal.  Skin: Skin is warm and dry. No rash noted. She is not diaphoretic.  Psychiatric: She has a normal mood and affect. Her behavior is normal.  Nursing note and vitals reviewed.   Right subacromial injection injection: Verbal consent obtained. Risk factors of bleeding and infection discussed with patient and patient is agreeable towards injection. Patient prepped with Betadine. Lateral approach towards injection used. Injected 80 mg of Depo-Medrol and 1 mL of 2% lidocaine. Patient tolerated procedure well and no side effects from noted. Minimal to no bleeding. Simple bandage applied after.     Assessment & Plan:   Problem List Items Addressed This Visit      Other   Postpartum depression   Relevant Medications   buPROPion (WELLBUTRIN XL) 300 MG 24 hr tablet    Other Visit Diagnoses    Subacromial bursitis of right shoulder joint    -  Primary   Relevant Medications   methylPREDNISolone acetate (DEPO-MEDROL) injection 80 mg (Completed)   Splinter hemorrhage of fingernail       Right index finger   Relevant Orders   Ambulatory referral to Dermatology   Disorder of menstrual bleeding       Patient has had persistent bleeding on Mirena, will do short course of estrogen   Relevant Medications   estradiol (ESTRACE) 1 MG tablet       Follow up plan: Return if symptoms worsen or fail to improve.  Counseling provided for all of the vaccine components Orders Placed This Encounter  Procedures  . Ambulatory referral to Dermatology    Caryl Pina, MD [facility] 04/04/2017, 2:49 PM

## 2017-04-04 NOTE — Addendum Note (Signed)
Addended by: Caryl Pina on: 04/04/2017 02:49 PM   Modules accepted: Orders

## 2017-05-01 ENCOUNTER — Telehealth: Payer: Self-pay | Admitting: Family Medicine

## 2017-05-02 MED ORDER — NORGESTIMATE-ETH ESTRADIOL 0.25-35 MG-MCG PO TABS: 1.0000 | ORAL_TABLET | Freq: Every day | ORAL | 2 refills | Status: DC

## 2017-05-02 NOTE — Telephone Encounter (Signed)
Patient did not do good with short course of estrogen will do 2 months of estrogen skip the sugar pill week.

## 2017-10-10 ENCOUNTER — Other Ambulatory Visit: Payer: Self-pay | Admitting: Family Medicine

## 2017-10-10 ENCOUNTER — Ambulatory Visit (INDEPENDENT_AMBULATORY_CARE_PROVIDER_SITE_OTHER): Admitting: Family Medicine

## 2017-10-10 ENCOUNTER — Encounter: Payer: Self-pay | Admitting: Family Medicine

## 2017-10-10 VITALS — BP 133/83 | HR 72 | Temp 98.1°F | Ht 66.0 in | Wt 201.0 lb

## 2017-10-10 DIAGNOSIS — M25511 Pain in right shoulder: Secondary | ICD-10-CM

## 2017-10-10 DIAGNOSIS — N644 Mastodynia: Secondary | ICD-10-CM | POA: Diagnosis not present

## 2017-10-10 DIAGNOSIS — N6320 Unspecified lump in the left breast, unspecified quadrant: Secondary | ICD-10-CM

## 2017-10-10 DIAGNOSIS — G8929 Other chronic pain: Secondary | ICD-10-CM | POA: Diagnosis not present

## 2017-10-10 NOTE — Progress Notes (Signed)
BP 133/83   Pulse 72   Temp 98.1 F (36.7 C) (Oral)   Ht 5\' 6"  (1.676 m)   Wt 201 lb (91.2 kg)   BMI 32.44 kg/m    Subjective:    Patient ID: Jenna Mendoza, female    DOB: 1991-01-16, 27 y.o.   MRN: [id number]  HPI: SOLIANA Mendoza is a 27 y.o. female presenting on 10/10/2017 for Shoulder Pain (Right- Patient was seen 04/04/17 and states her shoulder pain has gotten worse.) and lump under breast (Left breast- Patient states that is has been there 3-4 months and thought it was just from her bra wire but it has not went away. )  Left breast mass Patient has noticed a lump on her inferior lateral side of her left breast that showed up about 3 months ago. The lump does not come and go, but has been there constantly. The lump has not grown in size. She says it is always tender to the touch, but some days worse than others. She said both her mother and grandmother had breast cancer. She has not taken anything for pain.  She has not any skin changes or breast discharge  Right shoulder pain She has had this right shoulder pain for about 3 years now. She has had 2 steroid injections in her right shoulder at this office and two injections at an orthopedics office. She said the injections help for about a month, but then the pain comes right back. She states that the pain is the worst it has ever been in the past. She says if she's picking up a bag or hits her shoulder a certain way, she can't lift her right arm and has a sharp, pinching sensation. She has to massage her shoulder and the sensation will slowly resolve. She says sometimes when this occurs she notices some numbness in her pinky finger. She has the most trouble with the motion of undoing her bra. She currently is using anything or taking any medications for the pain. Besides the numbness in her pinky, she has not noticed any other numbness or tingling down her arm. 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  Relevant past medical, surgical, family and social history reviewed and updated as indicated. Interim medical history since our last visit reviewed. Allergies and medications reviewed and updated.  Review of Systems  Constitutional: Negative for chills and fever.  Respiratory: Negative for shortness of breath.   Cardiovascular: Negative for chest pain.  Musculoskeletal: Positive for myalgias (Right shoulder pain with certain movements). Negative for joint swelling.  Skin:       Lump on inferior lateral aspect of left breast    Per HPI unless specifically indicated above   Allergies as of 10/10/2017      Reactions   Shellfish Allergy Swelling      Medication List        Accurate as of 10/10/17 11:59 PM. Always use your most recent med list.          MIRENA (52 MG) 20 MCG/24HR IUD Generic drug:  levonorgestrel 1 each by Intrauterine route once.   levonorgestrel 20 MCG/24HR IUD Commonly known as:  MIRENA 1 Intra Uterine Device (1 each total) by Intrauterine route once for 1 dose. Inserted on 01/11/2017          Objective:    BP 133/83   Pulse 72   Temp 98.1 F (36.7 C) (Oral)  Ht 5\' 6"  (1.676 m)   Wt 201 lb (91.2 kg)   BMI 32.44 kg/m   Wt Readings from Last 3 Encounters:  10/10/17 201 lb (91.2 kg)  04/04/17 201 lb (91.2 kg)  01/11/17 201 lb (91.2 kg)    Physical Exam  Constitutional: She appears well-developed and well-nourished. No distress.  Cardiovascular: Normal rate, regular rhythm, normal heart sounds and intact distal pulses.  Pulmonary/Chest: Effort normal and breath sounds normal.  Musculoskeletal:       Right shoulder: She exhibits decreased range of motion (Worst with upper backwards movement, strength of shoulder is not affected, Positive empty can test) and tenderness (Most tender along superior lateral portion of right shoulder). She exhibits no swelling, no effusion, no crepitus, no deformity and normal strength.        Arms: Skin: No bruising, no laceration, no lesion and no rash noted. She is not diaphoretic. No erythema.           Assessment & Plan:   Problem List Items Addressed This Visit    None    Visit Diagnoses    Chronic right shoulder pain    -  Primary   Been going on for 3 years, will get MRI   Relevant Orders   MR Shoulder Right Wo Contrast   Breast pain, left       Relevant Orders   MM DIAG BREAST TOMO BILATERAL     Chronic right shoulder pain Possible rotator cuff tear or nerve impingement syndrome. Patient sent for MRI of right shoulder to get a better picture. Patient will follow up with orthopedic specialist for further treatment. We discussed whether to get another steroid injection completed today to decrease current pain, but it was decided to hold off until it is determined that cause of discomfort.  Left breast tenderness Due to patient's 3 month history of left breast lump without improvement and family history of breast cancer, patient was referred for mammogram. Patient told to follow up if signs of infection occur in this area.  Follow up plan: Return if symptoms worsen or fail to improve.  Counseling provided for all of the vaccine components Orders Placed This Encounter  Procedures  . MR Shoulder Right Wo Contrast  . MM DIAG BREAST TOMO BILATERAL    Caryl Pina, MD [facility] 10/13/2017, 9:59 PM

## 2017-10-15 ENCOUNTER — Ambulatory Visit (HOSPITAL_COMMUNITY)
Admission: RE | Admit: 2017-10-15 | Discharge: 2017-10-15 | Disposition: A | Discharge status: Home / Self Care | Source: Ambulatory Visit | Attending: Family Medicine | Admitting: Family Medicine

## 2017-10-15 DIAGNOSIS — N6320 Unspecified lump in the left breast, unspecified quadrant: Secondary | ICD-10-CM | POA: Insufficient documentation

## 2017-11-22 ENCOUNTER — Other Ambulatory Visit (HOSPITAL_COMMUNITY): Payer: Self-pay | Admitting: Orthopedic Surgery

## 2017-11-22 DIAGNOSIS — M25511 Pain in right shoulder: Secondary | ICD-10-CM

## 2017-11-27 ENCOUNTER — Ambulatory Visit (HOSPITAL_COMMUNITY)
Admission: RE | Admit: 2017-11-27 | Discharge: 2017-11-27 | Disposition: A | Discharge status: Home / Self Care | Source: Ambulatory Visit | Attending: Orthopedic Surgery | Admitting: Orthopedic Surgery

## 2017-11-27 DIAGNOSIS — M25411 Effusion, right shoulder: Secondary | ICD-10-CM | POA: Diagnosis not present

## 2017-11-27 DIAGNOSIS — M25511 Pain in right shoulder: Secondary | ICD-10-CM

## 2017-11-27 DIAGNOSIS — M7581 Other shoulder lesions, right shoulder: Secondary | ICD-10-CM | POA: Diagnosis not present

## 2017-11-27 DIAGNOSIS — M24011 Loose body in right shoulder: Secondary | ICD-10-CM | POA: Insufficient documentation

## 2017-12-09 ENCOUNTER — Other Ambulatory Visit: Payer: Self-pay

## 2017-12-09 DIAGNOSIS — M25511 Pain in right shoulder: Secondary | ICD-10-CM

## 2018-04-10 ENCOUNTER — Ambulatory Visit (INDEPENDENT_AMBULATORY_CARE_PROVIDER_SITE_OTHER): Admitting: Family Medicine

## 2018-04-10 ENCOUNTER — Other Ambulatory Visit: Payer: Self-pay

## 2018-04-10 ENCOUNTER — Encounter: Payer: Self-pay | Admitting: Family Medicine

## 2018-04-10 VITALS — BP 137/85 | HR 70 | Temp 97.8°F | Ht 66.0 in | Wt 208.2 lb

## 2018-04-10 DIAGNOSIS — E669 Obesity, unspecified: Secondary | ICD-10-CM | POA: Insufficient documentation

## 2018-04-10 DIAGNOSIS — R635 Abnormal weight gain: Secondary | ICD-10-CM

## 2018-04-10 DIAGNOSIS — R030 Elevated blood-pressure reading, without diagnosis of hypertension: Secondary | ICD-10-CM | POA: Diagnosis not present

## 2018-04-10 MED ORDER — PHENTERMINE HCL 30 MG PO CAPS: 30.0000 mg | ORAL_CAPSULE | ORAL | 0 refills | Status: DC

## 2018-04-10 NOTE — Progress Notes (Signed)
BP 137/85   Pulse 70   Temp 97.8 F (36.6 C) (Oral)   Ht 5' 6"  (1.676 m)   Wt 208 lb 3.2 oz (94.4 kg)   BMI 33.60 kg/m    Subjective:   Patient ID: Jenna Mendoza, female    DOB: 1990-12-20, 28 y.o.   MRN: [id number]  HPI: Jenna Mendoza is a 28 y.o. female presenting on 04/10/2018 for Weight Loss (Patient states she has been trying a lot of different methods to lose weight and is having trouble- would like to have labs done to see if there is an under lying issuse)   HPI Patient is coming in today to discuss obesity and weight gain.  She says she has been trying really hard on her own to lose weight.  She says she is doing weight watchers and has been watching her meals very consistently and has been going to the gym with a trainer 3 to 4 days a week for at least 45 minutes and even during this time with everything is closed down she still been working out at home.  She is feels like she has made all of these changes and has been trying to buckle down to do really well and is only lost 7 pounds in the past 4 months during this time.  She starting get very frustrated and she just feels like she is the only obese one in her family and the only obese 1 out of her group of friends is starting to affect her emotionally.  She is trying to do everything the right way she feels and is just not working.  She wants to know if there is any kind of testing or any kind of things that we could see if there are any underlying problems that could cause this.  Relevant past medical, surgical, family and social history reviewed and updated as indicated. Interim medical history since our last visit reviewed. Allergies and medications reviewed and updated.  Review of Systems  Constitutional: Negative for chills and fever.  Eyes: Negative for visual disturbance.  Respiratory: Negative for chest tightness and shortness of breath.   Cardiovascular: Negative for chest pain and leg swelling.  Endocrine:  Negative for cold intolerance, heat intolerance, polydipsia and polyuria.  Musculoskeletal: Negative for back pain and gait problem.  Skin: Negative for rash.  Neurological: Negative for light-headedness and headaches.  Psychiatric/Behavioral: Negative for agitation and behavioral problems.  All other systems reviewed and are negative.   Per HPI unless specifically indicated above   Allergies as of 04/10/2018      Reactions   Shellfish Allergy Swelling      Medication List       Accurate as of April 10, 2018  1:38 PM. Always use your most recent med list.        Mirena (52 MG) 20 MCG/24HR IUD Generic drug:  levonorgestrel 1 each by Intrauterine route once.   levonorgestrel 20 MCG/24HR IUD Commonly known as:  Mirena (52 MG) 1 Intra Uterine Device (1 each total) by Intrauterine route once for 1 dose. Inserted on 01/11/2017        Objective:   BP 137/85   Pulse 70   Temp 97.8 F (36.6 C) (Oral)   Ht 5' 6"  (1.676 m)   Wt 208 lb 3.2 oz (94.4 kg)   BMI 33.60 kg/m   Wt Readings from Last 3 Encounters:  04/10/18 208 lb 3.2 oz (94.4 kg)  10/10/17 201 lb (91.2  kg)  04/04/17 201 lb (91.2 kg)    Physical Exam Vitals signs and nursing note reviewed.  Constitutional:      General: She is not in acute distress.    Appearance: She is well-developed. She is not diaphoretic.  Eyes:     Conjunctiva/sclera: Conjunctivae normal.  Cardiovascular:     Rate and Rhythm: Normal rate and regular rhythm.     Heart sounds: Normal heart sounds. No murmur.  Pulmonary:     Effort: Pulmonary effort is normal. No respiratory distress.     Breath sounds: Normal breath sounds. No wheezing.  Musculoskeletal: Normal range of motion.        General: No tenderness.  Skin:    General: Skin is warm and dry.     Findings: No rash.  Neurological:     Mental Status: She is alert and oriented to person, place, and time.     Coordination: Coordination normal.  Psychiatric:        Behavior:  Behavior normal.       Assessment & Plan:   Problem List Items Addressed This Visit      Other   Obesity (BMI 30.0-34.9)   Relevant Medications   phentermine 30 MG capsule   Other Relevant Orders   Bayer DCA Hb A1c Waived   CBC with Differential/Platelet   CMP14+EGFR   FSH/LH   Insulin and C-Peptide   Lipid panel   Thyroid Panel With TSH    Other Visit Diagnoses    Weight gain    -  Primary   Relevant Medications   phentermine 30 MG capsule   Other Relevant Orders   Bayer DCA Hb A1c Waived   CBC with Differential/Platelet   CMP14+EGFR   FSH/LH   Insulin and C-Peptide   Lipid panel   Thyroid Panel With TSH   Elevated blood pressure reading          We will do testing for metabolic syndrome  Abdominal waist circumference 40 inches equals 101.6 cm Follow up plan: Return in about 8 weeks (around 06/05/2018), or if symptoms worsen or fail to improve, for Weight recheck.  Counseling provided for all of the vaccine components Orders Placed This Encounter  Procedures  . CBC with Differential/Platelet  . CMP14+EGFR  . Lipid panel  . Thyroid Panel With TSH  . Bayer DCA Hb A1c Waived  . FSH/LH  . Insulin and C-Peptide    Caryl Pina, MD [facility] 04/10/2018, 1:38 PM

## 2018-04-11 ENCOUNTER — Other Ambulatory Visit

## 2018-04-11 ENCOUNTER — Other Ambulatory Visit: Payer: Self-pay

## 2018-04-11 DIAGNOSIS — E669 Obesity, unspecified: Secondary | ICD-10-CM

## 2018-04-11 DIAGNOSIS — R635 Abnormal weight gain: Secondary | ICD-10-CM

## 2018-04-11 LAB — BAYER DCA HB A1C WAIVED: HB A1C (BAYER DCA - WAIVED): 5 % (ref <7.0)

## 2018-04-12 LAB — CMP14+EGFR
ALT: 16 IU/L (ref 0–32)
AST: 12 IU/L (ref 0–40)
Albumin/Globulin Ratio: 1.6 (ref 1.2–2.2)
Albumin: 4.6 g/dL (ref 3.9–5.0)
Alkaline Phosphatase: 68 IU/L (ref 39–117)
BUN / CREAT RATIO: 13 (ref 9–23)
BUN: 8 mg/dL (ref 6–20)
Bilirubin Total: 0.3 mg/dL (ref 0.0–1.2)
CO2: 22 mmol/L (ref 20–29)
CREATININE: 0.64 mg/dL (ref 0.57–1.00)
Calcium: 9.1 mg/dL (ref 8.7–10.2)
Chloride: 102 mmol/L (ref 96–106)
GFR calc Af Amer: 141 mL/min/{1.73_m2} (ref >59)
GFR calc non Af Amer: 123 mL/min/{1.73_m2} (ref >59)
GLUCOSE: 95 mg/dL (ref 65–99)
Globulin, Total: 2.8 g/dL (ref 1.5–4.5)
Potassium: 4.3 mmol/L (ref 3.5–5.2)
Sodium: 140 mmol/L (ref 134–144)
Total Protein: 7.4 g/dL (ref 6.0–8.5)

## 2018-04-12 LAB — CBC WITH DIFFERENTIAL/PLATELET
Basophils Absolute: 0 10*3/uL (ref 0.0–0.2)
Basos: 1 %
EOS (ABSOLUTE): 0.1 10*3/uL (ref 0.0–0.4)
EOS: 1 %
Hematocrit: 41.6 % (ref 34.0–46.6)
Hemoglobin: 14.4 g/dL (ref 11.1–15.9)
IMMATURE GRANULOCYTES: 0 %
Immature Grans (Abs): 0 10*3/uL (ref 0.0–0.1)
Lymphocytes Absolute: 2.8 10*3/uL (ref 0.7–3.1)
Lymphs: 43 %
MCH: 29.7 pg (ref 26.6–33.0)
MCHC: 34.6 g/dL (ref 31.5–35.7)
MCV: 86 fL (ref 79–97)
MONOS ABS: 0.3 10*3/uL (ref 0.1–0.9)
Monocytes: 5 %
NEUTROS PCT: 50 %
Neutrophils Absolute: 3.2 10*3/uL (ref 1.4–7.0)
Platelets: 251 10*3/uL (ref 150–450)
RBC: 4.85 x10E6/uL (ref 3.77–5.28)
RDW: 12.6 % (ref 11.7–15.4)
WBC: 6.5 10*3/uL (ref 3.4–10.8)

## 2018-04-12 LAB — LIPID PANEL
Chol/HDL Ratio: 5.8 ratio — ABNORMAL HIGH (ref 0.0–4.4)
Cholesterol, Total: 214 mg/dL — ABNORMAL HIGH (ref 100–199)
HDL: 37 mg/dL — ABNORMAL LOW (ref >39)
LDL CALC: 146 mg/dL — AB (ref 0–99)
TRIGLYCERIDES: 154 mg/dL — AB (ref 0–149)
VLDL Cholesterol Cal: 31 mg/dL (ref 5–40)

## 2018-04-12 LAB — INSULIN AND C-PEPTIDE, SERUM
C-Peptide: 5.2 ng/mL — ABNORMAL HIGH (ref 1.1–4.4)
INSULIN: 58.8 u[IU]/mL — ABNORMAL HIGH (ref 2.6–24.9)

## 2018-04-12 LAB — THYROID PANEL WITH TSH
Free Thyroxine Index: 1.8 (ref 1.2–4.9)
T3 Uptake Ratio: 24 % (ref 24–39)
T4 TOTAL: 7.7 ug/dL (ref 4.5–12.0)
TSH: 1.91 u[IU]/mL (ref 0.450–4.500)

## 2018-04-12 LAB — FSH/LH
FSH: 5.8 m[IU]/mL
LH: 11.5 m[IU]/mL

## 2018-05-09 ENCOUNTER — Encounter: Payer: Self-pay | Admitting: Family Medicine

## 2018-05-09 ENCOUNTER — Ambulatory Visit (INDEPENDENT_AMBULATORY_CARE_PROVIDER_SITE_OTHER): Admitting: Family Medicine

## 2018-05-09 ENCOUNTER — Other Ambulatory Visit: Payer: Self-pay

## 2018-05-09 DIAGNOSIS — R635 Abnormal weight gain: Secondary | ICD-10-CM

## 2018-05-09 DIAGNOSIS — E8881 Metabolic syndrome: Secondary | ICD-10-CM | POA: Diagnosis not present

## 2018-05-09 DIAGNOSIS — E66811 Obesity, class 1: Secondary | ICD-10-CM

## 2018-05-09 DIAGNOSIS — E669 Obesity, unspecified: Secondary | ICD-10-CM | POA: Diagnosis not present

## 2018-05-09 MED ORDER — LIRAGLUTIDE -WEIGHT MANAGEMENT 18 MG/3ML ~~LOC~~ SOPN: PEN_INJECTOR | SUBCUTANEOUS | 3 refills | Status: AC

## 2018-05-09 MED ORDER — PHENTERMINE HCL 30 MG PO CAPS: 30.0000 mg | ORAL_CAPSULE | ORAL | 0 refills | Status: DC

## 2018-05-09 NOTE — Progress Notes (Signed)
Virtual Visit via telephone Note  I connected with Jenna Mendoza on 05/09/18 at 1015 by telephone and verified that I am speaking with the correct person using two identifiers. Jenna Mendoza is currently located at home and no other people are currently with her during visit. The provider, Fransisca Kaufmann Melana Hingle, MD is located in their office at time of visit.  Call ended at 1035  I discussed the limitations, risks, security and privacy concerns of performing an evaluation and management service by telephone and the availability of in person appointments. I also discussed with the patient that there may be a patient responsible charge related to this service. The patient expressed understanding and agreed to proceed.  History and Present Illness: Obesity and weight  Weight 190.4, patient is doing stricter weight watchers and has been walking 3-4 miles per night.  She has been feeling better. Patient has been feeling well and denies chest pain or lightheadedness.   Patient feels like everything is going well and she is down 14 pounds and she is very happy about it and is continuing with her weight watchers.  No diagnosis found.  Outpatient Encounter Medications as of 05/09/2018  Medication Sig  . levonorgestrel (MIRENA, 52 MG,) 20 MCG/24HR IUD 1 Intra Uterine Device (1 each total) by Intrauterine route once for 1 dose. Inserted on 01/11/2017  . levonorgestrel (MIRENA, 52 MG,) 20 MCG/24HR IUD 1 each by Intrauterine route once.  . phentermine 30 MG capsule Take 1 capsule (30 mg total) by mouth every morning.   No facility-administered encounter medications on file as of 05/09/2018.     Review of Systems  Constitutional: Negative for chills and fever.  Eyes: Negative for visual disturbance.  Respiratory: Negative for chest tightness and shortness of breath.   Cardiovascular: Negative for chest pain, palpitations and leg swelling.  Musculoskeletal: Negative for back pain and gait problem.   Skin: Negative for rash.  Neurological: Negative for light-headedness and headaches.  Psychiatric/Behavioral: Negative for agitation and behavioral problems.  All other systems reviewed and are negative.   Observations/Objective: Patient sounds comfortable and in no acute distress  Assessment and Plan: Problem List Items Addressed This Visit      Other   Obesity (BMI 30.0-34.9) - Primary   Relevant Medications   phentermine 30 MG capsule   Liraglutide -Weight Management (SAXENDA) 18 MG/3ML SOPN    Other Visit Diagnoses    Weight gain       Relevant Medications   phentermine 30 MG capsule   Liraglutide -Weight Management (SAXENDA) 18 YC/1KG SOPN   Metabolic syndrome       based off waist circumference and bmi and insulin levels   Relevant Medications   Liraglutide -Weight Management (SAXENDA) 18 MG/3ML SOPN    Patient qualified for metabolic syndrome based on waist circumference and insulin elevation insensitivity and obesity or BMI.  Follow Up Instructions:  4 weeks recheck obesity  She will continue phentermine this month and then we will see if we can get the [place] covered so she can start it next month   I discussed the assessment and treatment plan with the patient. The patient was provided an opportunity to ask questions and all were answered. The patient agreed with the plan and demonstrated an understanding of the instructions.   The patient was advised to call back or seek an in-person evaluation if the symptoms worsen or if the condition fails to improve as anticipated.  The above assessment and management plan  was discussed with the patient. The patient verbalized understanding of and has agreed to the management plan. Patient is aware to call the clinic if symptoms persist or worsen. Patient is aware when to return to the clinic for a follow-up visit. Patient educated on when it is appropriate to go to the emergency department.    I provided 20 minutes of  non-face-to-face time during this encounter.    Worthy Rancher, MD

## 2018-05-13 ENCOUNTER — Telehealth: Payer: Self-pay

## 2018-05-13 NOTE — Telephone Encounter (Signed)
FYI, patient's insurance has denied [place].

## 2018-05-13 NOTE — Telephone Encounter (Signed)
Can we try and run a prior authorization using the diagnosis code of metabolic syndrome

## 2018-05-15 NOTE — Telephone Encounter (Signed)
I have resubmitted a prior authorization using the diagnosis of Metabolic Syndrome.

## 2018-06-05 ENCOUNTER — Other Ambulatory Visit: Payer: Self-pay

## 2018-06-06 ENCOUNTER — Encounter: Payer: Self-pay | Admitting: Family Medicine

## 2018-06-06 ENCOUNTER — Ambulatory Visit (INDEPENDENT_AMBULATORY_CARE_PROVIDER_SITE_OTHER): Admitting: Family Medicine

## 2018-06-06 VITALS — BP 124/77 | HR 83 | Temp 98.0°F | Ht 66.0 in | Wt 186.6 lb

## 2018-06-06 DIAGNOSIS — E669 Obesity, unspecified: Secondary | ICD-10-CM | POA: Diagnosis not present

## 2018-06-06 NOTE — Progress Notes (Signed)
BP 124/77   Pulse 83   Temp 98 F (36.7 C) (Oral)   Ht 5\' 6"  (1.676 m)   Wt 186 lb 9.6 oz (84.6 kg)   BMI 30.12 kg/m    Subjective:   Patient ID: Jenna Mendoza, female    DOB: 02/28/1990, 28 y.o.   MRN: [id number]  HPI: Jenna Mendoza is a 28 y.o. female presenting on 06/06/2018 for Weight Check   HPI Obesity and weight recheck Patient has been using phentermine 30 mg and has been losing about 2 to 3 pounds a week and has been doing well on it and will taper down over the next 7 days because she has a few left and then will taper onto the Saxenda.  She says she has been doing well and feels like she just needs to refocus and increase her exercise.  She has been doing weight watchers to help with the diet and that has been going well and she is really starting to feel a lot better through this process.  Relevant past medical, surgical, family and social history reviewed and updated as indicated. Interim medical history since our last visit reviewed. Allergies and medications reviewed and updated.  Review of Systems  Constitutional: Negative for chills and fever.  HENT: Negative for congestion, ear discharge and ear pain.   Eyes: Negative for visual disturbance.  Respiratory: Negative for chest tightness and shortness of breath.   Cardiovascular: Negative for chest pain and leg swelling.  Genitourinary: Negative for difficulty urinating and dysuria.  Musculoskeletal: Negative for back pain and gait problem.  Skin: Negative for rash.  Neurological: Negative for light-headedness and headaches.  Psychiatric/Behavioral: Negative for agitation and behavioral problems.  All other systems reviewed and are negative.   Per HPI unless specifically indicated above   Allergies as of 06/06/2018      Reactions   Shellfish Allergy Swelling      Medication List       Accurate as of Jun 06, 2018  4:09 PM. If you have any questions, ask your nurse or doctor.        Mirena (52 MG)  20 MCG/24HR IUD Generic drug:  levonorgestrel 1 each by Intrauterine route once.   levonorgestrel 20 MCG/24HR IUD Commonly known as:  Mirena (52 MG) 1 Intra Uterine Device (1 each total) by Intrauterine route once for 1 dose. Inserted on 01/11/2017   Liraglutide -Weight Management 18 MG/3ML Sopn Commonly known as:  Saxenda Inject 0.6 mg into the skin daily at 3 pm for 7 days, THEN 1.2 mg daily at 3 pm for 7 days, THEN 1.8 mg daily at 3 pm for 7 days, THEN 2.4 mg daily at 3 pm for 7 days, THEN 3 mg daily at 3 pm for 30 days. Start taking on:  May 09, 2018   phentermine 30 MG capsule Take 1 capsule (30 mg total) by mouth every morning.        Objective:   BP 124/77   Pulse 83   Temp 98 F (36.7 C) (Oral)   Ht 5\' 6"  (1.676 m)   Wt 186 lb 9.6 oz (84.6 kg)   BMI 30.12 kg/m   Wt Readings from Last 3 Encounters:  06/06/18 186 lb 9.6 oz (84.6 kg)  04/10/18 208 lb 3.2 oz (94.4 kg)  10/10/17 201 lb (91.2 kg)    Physical Exam Vitals signs and nursing note reviewed.  Constitutional:      General: She is not in acute  distress.    Appearance: She is well-developed. She is not diaphoretic.  Eyes:     Conjunctiva/sclera: Conjunctivae normal.  Cardiovascular:     Rate and Rhythm: Normal rate and regular rhythm.     Heart sounds: Normal heart sounds. No murmur.  Pulmonary:     Effort: Pulmonary effort is normal. No respiratory distress.     Breath sounds: Normal breath sounds. No wheezing.  Musculoskeletal: Normal range of motion.        General: No tenderness.  Skin:    General: Skin is warm and dry.     Findings: No rash.  Neurological:     Mental Status: She is alert and oriented to person, place, and time.     Coordination: Coordination normal.  Psychiatric:        Behavior: Behavior normal.       Assessment & Plan:   Problem List Items Addressed This Visit      Other   Obesity (BMI 30.0-34.9) - Primary      Patient will get Saxenda because it finally did  get prior authorization and she will transition onto that from the phentermine. Follow up plan: Return in about 4 weeks (around 07/04/2018), or if symptoms worsen or fail to improve, for Obesity management.  Counseling provided for all of the vaccine components No orders of the defined types were placed in this encounter.   Caryl Pina, MD [facility] 06/06/2018, 4:09 PM

## 2018-07-30 ENCOUNTER — Other Ambulatory Visit: Payer: Self-pay

## 2018-07-30 ENCOUNTER — Ambulatory Visit (INDEPENDENT_AMBULATORY_CARE_PROVIDER_SITE_OTHER): Admitting: Family Medicine

## 2018-07-30 ENCOUNTER — Encounter: Payer: Self-pay | Admitting: Family Medicine

## 2018-07-30 VITALS — BP 140/80 | HR 92 | Temp 98.4°F | Ht 66.0 in | Wt 184.0 lb

## 2018-07-30 DIAGNOSIS — Z30432 Encounter for removal of intrauterine contraceptive device: Secondary | ICD-10-CM

## 2018-07-30 DIAGNOSIS — E669 Obesity, unspecified: Secondary | ICD-10-CM | POA: Diagnosis not present

## 2018-07-30 NOTE — Progress Notes (Signed)
BP 140/80   Pulse 92   Temp 98.4 F (36.9 C) (Oral)   Ht 5\' 6"  (1.676 m)   Wt 184 lb (83.5 kg)   BMI 29.70 kg/m    Subjective:   Patient ID: Jenna Mendoza, female    DOB: July 28, 1990, 28 y.o.   MRN: [id number]  HPI: Jenna Mendoza is a 28 y.o. female presenting on 07/30/2018 for iud removal and Weight Loss (medication making her sick on her stomach )   HPI Weight loss recheck Patient is currently on Saxenda and thinks about getting pregnant, removed IUD, extended if medication that has not been shown to cause any issues during pregnancy but she will stop if she finds out at any point she is pregnant, I think she was okay to continue for now until pregnancy, she will lower dose because of nausea  IUD removal Patient is thinking about trying to get pregnant in October wants IUD removed so she can get her cycles regular then they will start trying in October.  Relevant past medical, surgical, family and social history reviewed and updated as indicated. Interim medical history since our last visit reviewed. Allergies and medications reviewed and updated.  Review of Systems  Constitutional: Negative for chills and fever.  Eyes: Negative for visual disturbance.  Respiratory: Negative for chest tightness and shortness of breath.   Cardiovascular: Negative for chest pain and leg swelling.  Gastrointestinal: Positive for nausea. Negative for abdominal pain.  Genitourinary: Negative for vaginal bleeding, vaginal discharge and vaginal pain.  Musculoskeletal: Negative for back pain and gait problem.  Skin: Negative for rash.  Neurological: Negative for light-headedness and headaches.  Psychiatric/Behavioral: Negative for agitation and behavioral problems.  All other systems reviewed and are negative.   Per HPI unless specifically indicated above   Allergies as of 07/30/2018      Reactions   Shellfish Allergy Swelling      Medication List       Accurate as of July 30, 2018  10:47 AM. If you have any questions, ask your nurse or doctor.        STOP taking these medications   levonorgestrel 20 MCG/24HR IUD Commonly known as: Mirena (52 MG) Stopped by: Fransisca Kaufmann Viliami Bracco, MD   Mirena (52 MG) 20 MCG/24HR IUD Generic drug: levonorgestrel Stopped by: Fransisca Kaufmann Reign Bartnick, MD   phentermine 30 MG capsule Stopped by: Fransisca Kaufmann Audianna Landgren, MD     TAKE these medications   Saxenda 18 MG/3ML Sopn Generic drug: Liraglutide -Weight Management INJECT 0.6 MG INTO THE SKIN DAILY AT 3PM FOR 7 DAYS THEN 1.2 MG DAILY AT 3PM FOR 7 DAYS THEN 1.8 MG DAILY AT 3PM FOR 7 DAYS THEN 2.4 MG DAIL        Objective:   BP 140/80   Pulse 92   Temp 98.4 F (36.9 C) (Oral)   Ht 5\' 6"  (1.676 m)   Wt 184 lb (83.5 kg)   BMI 29.70 kg/m   Wt Readings from Last 3 Encounters:  07/30/18 184 lb (83.5 kg)  06/06/18 186 lb 9.6 oz (84.6 kg)  04/10/18 208 lb 3.2 oz (94.4 kg)    Physical Exam Vitals signs and nursing note reviewed.  Constitutional:      General: She is not in acute distress.    Appearance: She is well-developed. She is not diaphoretic.  Eyes:     Conjunctiva/sclera: Conjunctivae normal.  Cardiovascular:     Rate and Rhythm: Normal rate and regular rhythm.  Heart sounds: Normal heart sounds. No murmur.  Pulmonary:     Effort: Pulmonary effort is normal. No respiratory distress.     Breath sounds: Normal breath sounds. No wheezing.  Genitourinary:    Vagina: Normal.     Cervix: Normal.     Uterus: Normal.      Adnexa: Right adnexa normal and left adnexa normal.  Musculoskeletal: Normal range of motion.        General: No tenderness.  Skin:    General: Skin is warm and dry.     Findings: No rash.  Neurological:     Mental Status: She is alert and oriented to person, place, and time.     Coordination: Coordination normal.  Psychiatric:        Behavior: Behavior normal.    IUD removal: Strings visible on exam, rings forceps used to grasp the strings and  remove the IUD without any issue, patient tolerated well   Assessment & Plan:   Problem List Items Addressed This Visit      Other   Obesity (BMI 30.0-34.9) - Primary    Other Visit Diagnoses    Encounter for IUD removal          IUD removed without issue, continue Saxenda until she finds out she is pregnant   Follow up plan: Return in about 4 weeks (around 08/27/2018), or if symptoms worsen or fail to improve, for 4-week weight recheck.  Counseling provided for all of the vaccine components No orders of the defined types were placed in this encounter.   Caryl Pina, MD [facility] 07/30/2018, 10:47 AM

## 2018-08-25 ENCOUNTER — Other Ambulatory Visit: Payer: Self-pay

## 2018-08-25 DIAGNOSIS — Z20822 Contact with and (suspected) exposure to covid-19: Secondary | ICD-10-CM

## 2018-08-26 LAB — NOVEL CORONAVIRUS, NAA: SARS-CoV-2, NAA: NOT DETECTED

## 2018-09-01 ENCOUNTER — Telehealth: Payer: Self-pay | Admitting: *Deleted

## 2018-09-01 NOTE — Telephone Encounter (Signed)
Saxenda needs Prior Auth- need to know the current weight of pt before I can proceed. Called and left message for pt to call back with her current weight.

## 2018-09-09 NOTE — Telephone Encounter (Signed)
Tried to contact the pt without a return call in over 3 days, will close encounter and PA request. If pt calls back and wants the medication she can call the pharmacy and have them request another PA.

## 2018-11-22 NOTE — Progress Notes (Signed)
Assessment & Plan:  1. Well adult exam - Preventive care education provided. Declined influenza vaccine and HIV screening. Labs completed in March 2020.  2. Obesity (BMI 30.0-34.9) - No longer taking Saxenda as she is trying to get pregnant. Encouraged a healthy diet and continuation of exercise.  3. Screening for tuberculosis - Patient to return Friday afternoon to have TB test read.  - PPD  Patient is getting immunization record from school to verify Hep B and MMR vaccination as this is not in [place]. She went to Dr. Orville Govern when she was a child and all of his records were destroyed in a natural disaster. She did attend public school so should be up to date with vaccinations.   Follow-up: Return in about 1 year (around 11/26/2019) for annual physical.   Hendricks Limes, MSN, APRN, FNP-C [facility]  Subjective:  Patient ID: Jenna Mendoza, female    DOB: 07-14-1990  Age: 28 y.o. MRN: [id number]  Patient Care Team: Dettinger, Fransisca Kaufmann, MD as PCP - General (Family Medicine)   CC:  Chief Complaint  Patient presents with  . physical for work    ppd given    HPI Jenna Mendoza presents for her annual physical.   Occupation: Pharmacist, hospital at [employer], Marital status: married, Substance use: none Diet: healthy, Exercise: walking Last eye exam: March 2020 Last dental exam: Years Last pap smear: 2017 with [facility] Immunizations: Flu Vaccine: declined  Tdap Vaccine: UTD   DEPRESSION SCREENING PHQ 2/9 Scores 11/26/2018 07/30/2018 06/06/2018 04/10/2018 10/10/2017 04/04/2017 01/11/2017  PHQ - 2 Score 0 0 0 0 1 0 0    Patient is currently trying to get pregnant.   Review of Systems  Constitutional: Negative for chills, fever, malaise/fatigue and weight loss.  HENT: Negative for congestion, ear discharge, ear pain, nosebleeds, sinus pain, sore throat and tinnitus.   Eyes: Negative for blurred vision, double vision, pain, discharge and redness.   Respiratory: Negative for cough, shortness of breath and wheezing.   Cardiovascular: Negative for chest pain, palpitations and leg swelling.  Gastrointestinal: Negative for abdominal pain, constipation, diarrhea, heartburn, nausea and vomiting.  Genitourinary: Negative for dysuria, frequency and urgency.  Musculoskeletal: Negative for myalgias.  Skin: Negative for rash.  Neurological: Negative for dizziness, seizures, weakness and headaches.  Psychiatric/Behavioral: Negative for depression, substance abuse and suicidal ideas. The patient is not nervous/anxious.    No current outpatient medications on file.  Allergies  Allergen Reactions  . Shellfish Allergy Anaphylaxis and Swelling    Past Medical History:  Diagnosis Date  . Allergy    shellfish  . Degenerative joint disease 2008  . Joint disorder 2008   degeneration  . Postpartum care following vaginal delivery (11/6) 11/21/2015  . Synovial chondromatoses   . Urinary tract infection during pregnancy mid december 2012    Past Surgical History:  Procedure Laterality Date  . SHOULDER SURGERY Right     Family History  Problem Relation Age of Onset  . Diabetes Mellitus II Paternal Grandfather   . Hypertension Father   . Breast cancer Maternal Grandmother 60  . Schizophrenia Mother   . Bipolar disorder Mother   . Breast cancer Mother 103  . Epilepsy Brother   . Anesthesia problems Neg Hx   . Hypotension Neg Hx   . Malignant hyperthermia Neg Hx   . Pseudochol deficiency Neg Hx     Social History   Socioeconomic History  . Marital status: Married  Spouse name: Not on file  . Number of children: Not on file  . Years of education: Not on file  . Highest education level: Not on file  Occupational History  . Not on file  Social Needs  . Financial resource strain: Not on file  . Food insecurity    Worry: Not on file    Inability: Not on file  . Transportation needs    Medical: Not on file    Non-medical: Not on  file  Tobacco Use  . Smoking status: Never Smoker  . Smokeless tobacco: Never Used  Substance and Sexual Activity  . Alcohol use: Yes    Alcohol/week: 1.0 standard drinks    Types: 1 Standard drinks or equivalent per week    Comment: not while preg  . Drug use: No  . Sexual activity: Yes    Partners: Male    Birth control/protection: None  Lifestyle  . Physical activity    Days per week: Not on file    Minutes per session: Not on file  . Stress: Not on file  Relationships  . Social Herbalist on phone: Not on file    Gets together: Not on file    Attends religious service: Not on file    Active member of club or organization: Not on file    Attends meetings of clubs or organizations: Not on file    Relationship status: Not on file  . Intimate partner violence    Fear of current or ex partner: Not on file    Emotionally abused: Not on file    Physically abused: Not on file    Forced sexual activity: Not on file  Other Topics Concern  . Not on file  Social History Narrative  . Not on file      Objective:    BP 125/78   Temp 99.5 F (37.5 C) (Temporal)   Ht 5' 6"  (1.676 m)   Wt 205 lb 3.2 oz (93.1 kg)   SpO2 98%   BMI 33.12 kg/m   Wt Readings from Last 3 Encounters:  11/26/18 205 lb 3.2 oz (93.1 kg)  07/30/18 184 lb (83.5 kg)  06/06/18 186 lb 9.6 oz (84.6 kg)    Physical Exam Vitals signs reviewed.  Constitutional:      General: She is not in acute distress.    Appearance: Normal appearance. She is obese. She is not ill-appearing, toxic-appearing or diaphoretic.  HENT:     Head: Normocephalic and atraumatic.     Right Ear: Tympanic membrane, ear canal and external ear normal. There is no impacted cerumen.     Left Ear: Tympanic membrane, ear canal and external ear normal. There is no impacted cerumen.     Nose: Nose normal. No congestion or rhinorrhea.     Mouth/Throat:     Mouth: Mucous membranes are moist.     Pharynx: Oropharynx is clear.  No oropharyngeal exudate or posterior oropharyngeal erythema.  Eyes:     General: No scleral icterus.       Right eye: No discharge.        Left eye: No discharge.     Conjunctiva/sclera: Conjunctivae normal.     Pupils: Pupils are equal, round, and reactive to light.  Neck:     Musculoskeletal: Normal range of motion and neck supple. No neck rigidity or muscular tenderness.  Cardiovascular:     Rate and Rhythm: Normal rate and regular rhythm.  Heart sounds: Normal heart sounds. No murmur. No friction rub. No gallop.   Pulmonary:     Effort: Pulmonary effort is normal. No respiratory distress.     Breath sounds: Normal breath sounds. No stridor. No wheezing, rhonchi or rales.  Chest:     Comments: Declined breast exam. Abdominal:     General: Abdomen is flat. Bowel sounds are normal. There is no distension.     Palpations: Abdomen is soft. There is no mass.     Tenderness: There is no abdominal tenderness. There is no guarding or rebound.     Hernia: No hernia is present.  Musculoskeletal: Normal range of motion.  Lymphadenopathy:     Cervical: No cervical adenopathy.  Skin:    General: Skin is warm and dry.     Capillary Refill: Capillary refill takes less than 2 seconds.  Neurological:     General: No focal deficit present.     Mental Status: She is alert and oriented to person, place, and time. Mental status is at baseline.  Psychiatric:        Mood and Affect: Mood normal.        Behavior: Behavior normal.        Thought Content: Thought content normal.        Judgment: Judgment normal.     Lab Results  Component Value Date   TSH 1.910 04/11/2018   Lab Results  Component Value Date   WBC 6.5 04/11/2018   HGB 14.4 04/11/2018   HCT 41.6 04/11/2018   MCV 86 04/11/2018   PLT 251 04/11/2018   Lab Results  Component Value Date   NA 140 04/11/2018   K 4.3 04/11/2018   CO2 22 04/11/2018   GLUCOSE 95 04/11/2018   BUN 8 04/11/2018   CREATININE 0.64 04/11/2018    BILITOT 0.3 04/11/2018   ALKPHOS 68 04/11/2018   AST 12 04/11/2018   ALT 16 04/11/2018   PROT 7.4 04/11/2018   ALBUMIN 4.6 04/11/2018   CALCIUM 9.1 04/11/2018   Lab Results  Component Value Date   CHOL 214 (H) 04/11/2018   Lab Results  Component Value Date   HDL 37 (L) 04/11/2018   Lab Results  Component Value Date   LDLCALC 146 (H) 04/11/2018   Lab Results  Component Value Date   TRIG 154 (H) 04/11/2018   Lab Results  Component Value Date   CHOLHDL 5.8 (H) 04/11/2018   Lab Results  Component Value Date   HGBA1C 5.0 04/11/2018

## 2018-11-25 ENCOUNTER — Other Ambulatory Visit: Payer: Self-pay

## 2018-11-26 ENCOUNTER — Encounter: Payer: Self-pay | Admitting: Family Medicine

## 2018-11-26 ENCOUNTER — Ambulatory Visit (INDEPENDENT_AMBULATORY_CARE_PROVIDER_SITE_OTHER): Payer: BC Managed Care – PPO | Admitting: Family Medicine

## 2018-11-26 VITALS — BP 125/78 | Temp 99.5°F | Ht 66.0 in | Wt 205.2 lb

## 2018-11-26 DIAGNOSIS — Z111 Encounter for screening for respiratory tuberculosis: Secondary | ICD-10-CM | POA: Diagnosis not present

## 2018-11-26 DIAGNOSIS — Z0001 Encounter for general adult medical examination with abnormal findings: Secondary | ICD-10-CM | POA: Diagnosis not present

## 2018-11-26 DIAGNOSIS — Z0000 Encounter for general adult medical examination without abnormal findings: Secondary | ICD-10-CM

## 2018-11-26 DIAGNOSIS — E669 Obesity, unspecified: Secondary | ICD-10-CM

## 2018-11-26 NOTE — Patient Instructions (Signed)
High Cholesterol  High cholesterol is a condition in which the blood has high levels of a white, waxy, fat-like substance (cholesterol). The human body needs small amounts of cholesterol. The liver makes all the cholesterol that the body needs. Extra (excess) cholesterol comes from the food that we eat. Cholesterol is carried from the liver by the blood through the blood vessels. If you have high cholesterol, deposits (plaques) may build up on the walls of your blood vessels (arteries). Plaques make the arteries narrower and stiffer. Cholesterol plaques increase your risk for heart attack and stroke. Work with your health care provider to keep your cholesterol levels in a healthy range. What increases the risk? This condition is more likely to develop in people who:  Eat foods that are high in animal fat (saturated fat) or cholesterol.  Are overweight.  Are not getting enough exercise.  Have a family history of high cholesterol. What are the signs or symptoms? There are no symptoms of this condition. How is this diagnosed? This condition may be diagnosed from the results of a blood test.  If you are older than age 66, your health care provider may check your cholesterol every 4-6 years.  You may be checked more often if you already have high cholesterol or other risk factors for heart disease. The blood test for cholesterol measures:  "Bad" cholesterol (LDL cholesterol). This is the main type of cholesterol that causes heart disease. The desired level for LDL is less than 100.  "Good" cholesterol (HDL cholesterol). This type helps to protect against heart disease by cleaning the arteries and carrying the LDL away. The desired level for HDL is 60 or higher.  Triglycerides. These are fats that the body can store or burn for energy. The desired number for triglycerides is lower than 150.  Total cholesterol. This is a measure of the total amount of cholesterol in your blood, including  LDL cholesterol, HDL cholesterol, and triglycerides. A healthy number is less than 200. How is this treated? This condition is treated with diet changes, lifestyle changes, and medicines. Diet changes  This may include eating more whole grains, fruits, vegetables, nuts, and fish.  This may also include cutting back on red meat and foods that have a lot of added sugar. Lifestyle changes  Changes may include getting at least 40 minutes of aerobic exercise 3 times a week. Aerobic exercises include walking, biking, and swimming. Aerobic exercise along with a healthy diet can help you maintain a healthy weight.  Changes may also include quitting smoking. Medicines  Medicines are usually given if diet and lifestyle changes have failed to reduce your cholesterol to healthy levels.  Your health care provider may prescribe a statin medicine. Statin medicines have been shown to reduce cholesterol, which can reduce the risk of heart disease. Follow these instructions at home: Eating and drinking If told by your health care provider:  Eat chicken (without skin), fish, veal, shellfish, ground [place] breast, and round or loin cuts of red meat.  Do not eat fried foods or fatty meats, such as hot dogs and salami.  Eat plenty of fruits, such as apples.  Eat plenty of vegetables, such as broccoli, potatoes, and carrots.  Eat beans, peas, and lentils.  Eat grains such as barley, rice, couscous, and bulgur wheat.  Eat pasta without cream sauces.  Use skim or nonfat milk, and eat low-fat or nonfat yogurt and cheeses.  Do not eat or drink whole milk, cream, ice cream, egg  yolks, or hard cheeses.  Do not eat stick margarine or tub margarines that contain trans fats (also called partially hydrogenated oils).  Do not eat saturated tropical oils, such as coconut oil and palm oil.  Do not eat cakes, cookies, crackers, or other baked goods that contain trans fats.  General instructions  Exercise  as directed by your health care provider. Increase your activity level with activities such as gardening, walking, and taking the stairs.  Take over-the-counter and prescription medicines only as told by your health care provider.  Do not use any products that contain nicotine or tobacco, such as cigarettes and e-cigarettes. If you need help quitting, ask your health care provider.  Keep all follow-up visits as told by your health care provider. This is important. Contact a health care provider if:  You are struggling to maintain a healthy diet or weight.  You need help to start on an exercise program.  You need help to stop smoking. Get help right away if:  You have chest pain.  You have trouble breathing. This information is not intended to replace advice given to you by your health care provider. Make sure you discuss any questions you have with your health care provider. Document Released: 01/01/2005 Document Revised: 01/04/2017 Document Reviewed: 07/02/2015 Elsevier Patient Education  2020 [place] 20-23 Years Old, Female Preventive care refers to visits with your health care provider and lifestyle choices that can promote health and wellness. This includes:  A yearly physical exam. This may also be called an annual well check.  Regular dental visits and eye exams.  Immunizations.  Screening for certain conditions.  Healthy lifestyle choices, such as eating a healthy diet, getting regular exercise, not using drugs or products that contain nicotine and tobacco, and limiting alcohol use. What can I expect for my preventive care visit? Physical exam Your health care provider will check your:  Height and weight. This may be used to calculate body mass index (BMI), which tells if you are at a healthy weight.  Heart rate and blood pressure.  Skin for abnormal spots. Counseling Your health care provider may ask you questions about your:  Alcohol,  tobacco, and drug use.  Emotional well-being.  Home and relationship well-being.  Sexual activity.  Eating habits.  Work and work Statistician.  Method of birth control.  Menstrual cycle.  Pregnancy history. What immunizations do I need?  Influenza (flu) vaccine  This is recommended every year. Tetanus, diphtheria, and pertussis (Tdap) vaccine  You may need a Td booster every 10 years. Varicella (chickenpox) vaccine  You may need this if you have not been vaccinated. Human papillomavirus (HPV) vaccine  If recommended by your health care provider, you may need three doses over 6 months. Measles, mumps, and rubella (MMR) vaccine  You may need at least one dose of MMR. You may also need a second dose. Meningococcal conjugate (MenACWY) vaccine  One dose is recommended if you are age 15-21 years and a first-year college student living in a residence hall, or if you have one of several medical conditions. You may also need additional booster doses. Pneumococcal conjugate (PCV13) vaccine  You may need this if you have certain conditions and were not previously vaccinated. Pneumococcal polysaccharide (PPSV23) vaccine  You may need one or two doses if you smoke cigarettes or if you have certain conditions. Hepatitis A vaccine  You may need this if you have certain conditions or if you travel or work  in places where you may be exposed to hepatitis A. Hepatitis B vaccine  You may need this if you have certain conditions or if you travel or work in places where you may be exposed to hepatitis B. Haemophilus influenzae type b (Hib) vaccine  You may need this if you have certain conditions. You may receive vaccines as individual doses or as more than one vaccine together in one shot (combination vaccines). Talk with your health care provider about the risks and benefits of combination vaccines. What tests do I need?  Blood tests  Lipid and cholesterol levels. These may be  checked every 5 years starting at age 26.  Hepatitis C test.  Hepatitis B test. Screening  Diabetes screening. This is done by checking your blood sugar (glucose) after you have not eaten for a while (fasting).  Sexually transmitted disease (STD) testing.  BRCA-related cancer screening. This may be done if you have a family history of breast, ovarian, tubal, or peritoneal cancers.  Pelvic exam and Pap test. This may be done every 3 years starting at age 12. Starting at age 63, this may be done every 5 years if you have a Pap test in combination with an HPV test. Talk with your health care provider about your test results, treatment options, and if necessary, the need for more tests. Follow these instructions at home: Eating and drinking   Eat a diet that includes fresh fruits and vegetables, whole grains, lean protein, and low-fat dairy.  Take vitamin and mineral supplements as recommended by your health care provider.  Do not drink alcohol if: ? Your health care provider tells you not to drink. ? You are pregnant, may be pregnant, or are planning to become pregnant.  If you drink alcohol: ? Limit how much you have to 0-1 drink a day. ? Be aware of how much alcohol is in your drink. In the [place] one drink equals one 12 oz bottle of beer (355 mL), one 5 oz glass of wine (148 mL), or one 1 oz glass of hard liquor (44 mL). Lifestyle  Take daily care of your teeth and gums.  Stay active. Exercise for at least 30 minutes on 5 or more days each week.  Do not use any products that contain nicotine or tobacco, such as cigarettes, e-cigarettes, and chewing tobacco. If you need help quitting, ask your health care provider.  If you are sexually active, practice safe sex. Use a condom or other form of birth control (contraception) in order to prevent pregnancy and STIs (sexually transmitted infections). If you plan to become pregnant, see your health care provider for a preconception  visit. What's next?  Visit your health care provider once a year for a well check visit.  Ask your health care provider how often you should have your eyes and teeth checked.  Stay up to date on all vaccines. This information is not intended to replace advice given to you by your health care provider. Make sure you discuss any questions you have with your health care provider. Document Released: 02/27/2001 Document Revised: 09/12/2017 Document Reviewed: 09/12/2017 Elsevier Patient Education  2020 [employer].

## 2018-11-28 ENCOUNTER — Ambulatory Visit: Admitting: Family Medicine

## 2018-11-28 ENCOUNTER — Other Ambulatory Visit: Payer: Self-pay

## 2018-11-28 DIAGNOSIS — Z0184 Encounter for antibody response examination: Secondary | ICD-10-CM

## 2018-11-28 LAB — TB SKIN TEST
Induration: 0 mm
TB Skin Test: NEGATIVE

## 2018-12-01 LAB — MEASLES/MUMPS/RUBELLA IMMUNITY
MUMPS ABS, IGG: 50.5 AU/mL (ref >10.9)
RUBEOLA AB, IGG: 276 AU/mL (ref >16.4)
Rubella Antibodies, IGG: 1.55 index (ref >0.99)

## 2018-12-01 LAB — VARICELLA ZOSTER ANTIBODY, IGG: Varicella zoster IgG: 303 index (ref >165)

## 2018-12-01 LAB — HEPATITIS B SURFACE ANTIBODY, QUANTITATIVE: Hepatitis B Surf Ab Quant: <3.1 m[IU]/mL — ABNORMAL LOW (ref >9.9)

## 2019-01-16 NOTE — L&D Delivery Note (Addendum)
Delivery Note CHARRISE LARDNER is a [id number] at [redacted]w[redacted]d who had a spontaneous delivery at 00:32 on 08/04/19 of a viable female "Barrett" was delivered via LOA.  APGAR: 9, 9; weight 3671g (8lb1.5oz). Admitted for augmentation of latent labor in setting of a late deceleration. Diagnosed with GHTN during labor course. Augmented vwith pitocin and AROM. Progressed normally. Received an epidural for pain management. Pushed for 1 contraction. Baby was delivered without difficulty. No nuchal cord. Baby placed on maternal abdomen. Delayed cord clamping for 60 seconds. Delivery of placenta was spontaneous. Placenta was found to be intact,  3-vessel cord was noted. The fundus was found to be firm. No lacerations. Estimated blood loss 50cc. 1 hour after delivery she was passing clots with fundal massage, 869mcg cytotec placed rectally. Fundus firm. Instrument and gauze counts were correct at the end of the procedure. Placenta status: L&D Mom to postpartum.  Baby to Couplet care / Skin to Skin.  Luigi Stuckey K Taam-Akelman 08/04/2019, 12:44 AM

## 2019-01-21 LAB — OB RESULTS CONSOLE HEPATITIS B SURFACE ANTIGEN: Hepatitis B Surface Ag: NEGATIVE

## 2019-01-21 LAB — HIV ANTIBODY (ROUTINE TESTING W REFLEX): HIV Screen 4th Generation wRfx: NONREACTIVE

## 2019-01-21 LAB — OB RESULTS CONSOLE RUBELLA ANTIBODY, IGM: Rubella: IMMUNE

## 2019-01-21 LAB — OB RESULTS CONSOLE RPR: RPR: NONREACTIVE

## 2019-01-28 ENCOUNTER — Ambulatory Visit: Attending: Internal Medicine

## 2019-01-28 ENCOUNTER — Other Ambulatory Visit: Payer: Self-pay

## 2019-01-28 DIAGNOSIS — Z20822 Contact with and (suspected) exposure to covid-19: Secondary | ICD-10-CM

## 2019-01-29 LAB — NOVEL CORONAVIRUS, NAA: SARS-CoV-2, NAA: NOT DETECTED

## 2019-05-22 LAB — OB RESULTS CONSOLE RPR: RPR: NONREACTIVE

## 2019-07-17 LAB — OB RESULTS CONSOLE GBS: GBS: NEGATIVE

## 2019-07-31 ENCOUNTER — Encounter (HOSPITAL_COMMUNITY): Payer: Self-pay | Admitting: Obstetrics and Gynecology

## 2019-07-31 ENCOUNTER — Other Ambulatory Visit: Payer: Self-pay

## 2019-07-31 ENCOUNTER — Inpatient Hospital Stay (HOSPITAL_COMMUNITY)
Admission: AD | Admit: 2019-07-31 | Discharge: 2019-08-01 | Disposition: A | Discharge status: Home / Self Care | Payer: BC Managed Care – PPO | Attending: Obstetrics and Gynecology | Admitting: Obstetrics and Gynecology

## 2019-07-31 DIAGNOSIS — Z0371 Encounter for suspected problem with amniotic cavity and membrane ruled out: Secondary | ICD-10-CM

## 2019-07-31 DIAGNOSIS — Z3A38 38 weeks gestation of pregnancy: Secondary | ICD-10-CM

## 2019-07-31 DIAGNOSIS — Z3689 Encounter for other specified antenatal screening: Secondary | ICD-10-CM

## 2019-07-31 NOTE — MAU Note (Signed)
PT SAYS AT 6PM HAD SMALL AMT FLUID. UC'S ARE 16 MIN APART.  VE ON Monday 3 CM . GBS- NEG. DENIES HSV.

## 2019-08-01 DIAGNOSIS — Z3A38 38 weeks gestation of pregnancy: Secondary | ICD-10-CM

## 2019-08-01 DIAGNOSIS — R030 Elevated blood-pressure reading, without diagnosis of hypertension: Secondary | ICD-10-CM

## 2019-08-01 DIAGNOSIS — Z0371 Encounter for suspected problem with amniotic cavity and membrane ruled out: Secondary | ICD-10-CM

## 2019-08-01 DIAGNOSIS — O26893 Other specified pregnancy related conditions, third trimester: Secondary | ICD-10-CM | POA: Diagnosis not present

## 2019-08-01 DIAGNOSIS — H539 Unspecified visual disturbance: Secondary | ICD-10-CM

## 2019-08-01 LAB — COMPREHENSIVE METABOLIC PANEL
ALT: 14 U/L (ref 0–44)
AST: 16 U/L (ref 15–41)
Albumin: 2.8 g/dL — ABNORMAL LOW (ref 3.5–5.0)
Alkaline Phosphatase: 174 U/L — ABNORMAL HIGH (ref 38–126)
Anion gap: 9 (ref 5–15)
BUN: 6 mg/dL (ref 6–20)
CO2: 21 mmol/L — ABNORMAL LOW (ref 22–32)
Calcium: 8.3 mg/dL — ABNORMAL LOW (ref 8.9–10.3)
Chloride: 106 mmol/L (ref 98–111)
Creatinine, Ser: 0.5 mg/dL (ref 0.44–1.00)
GFR calc Af Amer: >60 mL/min (ref >60)
GFR calc non Af Amer: >60 mL/min (ref >60)
Glucose, Bld: 104 mg/dL — ABNORMAL HIGH (ref 70–99)
Potassium: 3.6 mmol/L (ref 3.5–5.1)
Sodium: 136 mmol/L (ref 135–145)
Total Bilirubin: 0.5 mg/dL (ref 0.3–1.2)
Total Protein: 6.3 g/dL — ABNORMAL LOW (ref 6.5–8.1)

## 2019-08-01 LAB — CBC
HCT: 40.1 % (ref 36.0–46.0)
Hemoglobin: 12.9 g/dL (ref 12.0–15.0)
MCH: 29.2 pg (ref 26.0–34.0)
MCHC: 32.2 g/dL (ref 30.0–36.0)
MCV: 90.7 fL (ref 80.0–100.0)
Platelets: 199 10*3/uL (ref 150–400)
RBC: 4.42 MIL/uL (ref 3.87–5.11)
RDW: 13.5 % (ref 11.5–15.5)
WBC: 7.8 10*3/uL (ref 4.0–10.5)
nRBC: 0 % (ref 0.0–0.2)

## 2019-08-01 LAB — PROTEIN / CREATININE RATIO, URINE
Creatinine, Urine: 135 mg/dL
Protein Creatinine Ratio: 0.2 mg/mg{Cre} — ABNORMAL HIGH (ref 0.00–0.15)
Total Protein, Urine: 27 mg/dL

## 2019-08-01 LAB — POCT FERN TEST: POCT Fern Test: NEGATIVE

## 2019-08-01 NOTE — MAU Provider Note (Addendum)
S: [name] is a 29 y.o. [phone] at [redacted]w[redacted]d  who presents to MAU today complaining of leaking of fluid since 6pm with small gush fluid. She denies vaginal bleeding, but reports some vaginal spotting yesterday. She endorses contractions q15-17 minutes. She reports normal fetal movement.    O: There were no vitals taken for this visit. GENERAL: Well-developed, well-nourished female in no acute distress.  HEAD: Normocephalic, atraumatic.  CHEST: Normal effort of breathing, regular heart rate ABDOMEN: Soft, nontender, gravid PELVIC: Normal external female genitalia. Vagina is pink and rugated. Cervix with normal contour, no lesions. Normal discharge.  Negative pooling. [place].  Cervical exam:  Dilation: 3.5 Effacement (%): 90 Cervical Position: Middle Station: -2 Presentation: Vertex Exam by:: K.Wilson,RN   Fetal Monitoring: FHT: 130 bpm, Mod Var, -Decels, +Accels Toco: No ctx graphed  Results for orders placed or performed during the hospital encounter of 07/31/19 (from the past 24 hour(s))  POCT fern test     Status: Normal   Collection Time: 08/01/19 12:20 AM  Result Value Ref Range   POCT Fern Test Negative = intact amniotic membranes   Protein / creatinine ratio, urine     Status: Abnormal   Collection Time: 08/01/19  1:21 AM  Result Value Ref Range   Creatinine, Urine 135.00 mg/dL   Total Protein, Urine 27 mg/dL   Protein Creatinine Ratio 0.20 (H) 0.00 - 0.15 mg/mg[Cre]  Comprehensive metabolic panel     Status: Abnormal   Collection Time: 08/01/19  1:25 AM  Result Value Ref Range   Sodium 136 135 - 145 mmol/L   Potassium 3.6 3.5 - 5.1 mmol/L   Chloride 106 98 - 111 mmol/L   CO2 21 (L) 22 - 32 mmol/L   Glucose, Bld 104 (H) 70 - 99 mg/dL   BUN 6 6 - 20 mg/dL   Creatinine, Ser 0.50 0.44 - 1.00 mg/dL   Calcium 8.3 (L) 8.9 - 10.3 mg/dL   Total Protein 6.3 (L) 6.5 - 8.1 g/dL   Albumin 2.8 (L) 3.5 - 5.0 g/dL   AST 16 15 - 41 U/L   ALT 14 0 - 44 U/L    Alkaline Phosphatase 174 (H) 38 - 126 U/L   Total Bilirubin 0.5 0.3 - 1.2 mg/dL   GFR calc non Af Amer >60 >60 mL/min   GFR calc Af Amer >60 >60 mL/min   Anion gap 9 5 - 15  CBC     Status: None   Collection Time: 08/01/19  1:25 AM  Result Value Ref Range   WBC 7.8 4.0 - 10.5 K/uL   RBC 4.42 3.87 - 5.11 MIL/uL   Hemoglobin 12.9 12.0 - 15.0 g/dL   HCT 40.1 36 - 46 %   MCV 90.7 80.0 - 100.0 fL   MCH 29.2 26.0 - 34.0 pg   MCHC 32.2 30.0 - 36.0 g/dL   RDW 13.5 11.5 - 15.5 %   Platelets 199 150 - 400 K/uL   nRBC 0.0 0.0 - 0.2 %     A: [facility]UP at [redacted]w[redacted]d  Membranes intact  P: Fern negative Patient and husband informed of results. Discharged to home with labor precautions.   Jenna Mendoza, CNM 08/01/2019 12:32 AM   Reassessment (12:56 AM) Nurse takes blood pressure and noted to be elevated outside normal parameters. Will order PreEclampsia work up to rule out. Continue to monitor and cycle blood pressures.  Reassessment (2:24 AM)  Vitals:   08/01/19 0131 08/01/19 0146 08/01/19 0201 08/01/19  0216  BP: 127/75 120/68 121/75 131/84  Pulse: 84 72 74 78  Resp:        -Blood pressures remain normotensive.  -Labs return as above. -Provider to bedside to discuss. -PreEclampsia precautions given. -Patient and husband without questions or concerns. -Encouraged to call or return to MAU if symptoms worsen or with the onset of new symptoms. -Discharged to home in stable condition.   Maryann Conners MSN, CNM Advanced Practice Provider, [facility]

## 2019-08-03 ENCOUNTER — Inpatient Hospital Stay (HOSPITAL_COMMUNITY): Payer: BC Managed Care – PPO | Admitting: Anesthesiology

## 2019-08-03 ENCOUNTER — Inpatient Hospital Stay (HOSPITAL_BASED_OUTPATIENT_CLINIC_OR_DEPARTMENT_OTHER): Payer: BC Managed Care – PPO

## 2019-08-03 ENCOUNTER — Encounter (HOSPITAL_COMMUNITY): Payer: Self-pay | Admitting: Obstetrics & Gynecology

## 2019-08-03 ENCOUNTER — Inpatient Hospital Stay (HOSPITAL_COMMUNITY)
Admission: AD | Admit: 2019-08-03 | Discharge: 2019-08-05 | DRG: 807 | Disposition: A | Discharge status: Home / Self Care | Payer: BC Managed Care – PPO | Attending: Obstetrics & Gynecology | Admitting: Obstetrics & Gynecology

## 2019-08-03 ENCOUNTER — Other Ambulatory Visit: Payer: Self-pay

## 2019-08-03 DIAGNOSIS — H539 Unspecified visual disturbance: Secondary | ICD-10-CM

## 2019-08-03 DIAGNOSIS — O368330 Maternal care for abnormalities of the fetal heart rate or rhythm, third trimester, not applicable or unspecified: Secondary | ICD-10-CM

## 2019-08-03 DIAGNOSIS — O99214 Obesity complicating childbirth: Secondary | ICD-10-CM | POA: Diagnosis present

## 2019-08-03 DIAGNOSIS — O26893 Other specified pregnancy related conditions, third trimester: Secondary | ICD-10-CM

## 2019-08-03 DIAGNOSIS — E669 Obesity, unspecified: Secondary | ICD-10-CM | POA: Diagnosis present

## 2019-08-03 DIAGNOSIS — Z6791 Unspecified blood type, Rh negative: Secondary | ICD-10-CM

## 2019-08-03 DIAGNOSIS — O99891 Other specified diseases and conditions complicating pregnancy: Secondary | ICD-10-CM | POA: Diagnosis not present

## 2019-08-03 DIAGNOSIS — R030 Elevated blood-pressure reading, without diagnosis of hypertension: Secondary | ICD-10-CM | POA: Diagnosis not present

## 2019-08-03 DIAGNOSIS — O134 Gestational [pregnancy-induced] hypertension without significant proteinuria, complicating childbirth: Secondary | ICD-10-CM | POA: Diagnosis not present

## 2019-08-03 DIAGNOSIS — Z20822 Contact with and (suspected) exposure to covid-19: Secondary | ICD-10-CM | POA: Diagnosis present

## 2019-08-03 DIAGNOSIS — R519 Headache, unspecified: Secondary | ICD-10-CM

## 2019-08-03 DIAGNOSIS — Z3A38 38 weeks gestation of pregnancy: Secondary | ICD-10-CM

## 2019-08-03 DIAGNOSIS — F329 Major depressive disorder, single episode, unspecified: Secondary | ICD-10-CM | POA: Diagnosis present

## 2019-08-03 DIAGNOSIS — O99344 Other mental disorders complicating childbirth: Secondary | ICD-10-CM | POA: Diagnosis present

## 2019-08-03 DIAGNOSIS — Z3689 Encounter for other specified antenatal screening: Secondary | ICD-10-CM

## 2019-08-03 DIAGNOSIS — O368390 Maternal care for abnormalities of the fetal heart rate or rhythm, unspecified trimester, not applicable or unspecified: Secondary | ICD-10-CM | POA: Diagnosis present

## 2019-08-03 LAB — URINALYSIS, ROUTINE W REFLEX MICROSCOPIC
Bacteria, UA: NONE SEEN
Bilirubin Urine: NEGATIVE
Glucose, UA: 50 mg/dL — AB
Hgb urine dipstick: NEGATIVE
Ketones, ur: NEGATIVE mg/dL
Nitrite: NEGATIVE
Protein, ur: NEGATIVE mg/dL
Specific Gravity, Urine: 1.01 (ref 1.005–1.030)
pH: 6 (ref 5.0–8.0)

## 2019-08-03 LAB — CBC
HCT: 38 % (ref 36.0–46.0)
HCT: 39.7 % (ref 36.0–46.0)
Hemoglobin: 12.4 g/dL (ref 12.0–15.0)
Hemoglobin: 12.9 g/dL (ref 12.0–15.0)
MCH: 29.4 pg (ref 26.0–34.0)
MCH: 29.1 pg (ref 26.0–34.0)
MCHC: 32.6 g/dL (ref 30.0–36.0)
MCHC: 32.5 g/dL (ref 30.0–36.0)
MCV: 90 fL (ref 80.0–100.0)
MCV: 89.6 fL (ref 80.0–100.0)
Platelets: 188 10*3/uL (ref 150–400)
Platelets: 206 10*3/uL (ref 150–400)
RBC: 4.22 MIL/uL (ref 3.87–5.11)
RBC: 4.43 MIL/uL (ref 3.87–5.11)
RDW: 13.4 % (ref 11.5–15.5)
RDW: 13.5 % (ref 11.5–15.5)
WBC: 6.2 10*3/uL (ref 4.0–10.5)
WBC: 7.4 10*3/uL (ref 4.0–10.5)
nRBC: 0 % (ref 0.0–0.2)
nRBC: 0 % (ref 0.0–0.2)

## 2019-08-03 LAB — COMPREHENSIVE METABOLIC PANEL
ALT: 14 U/L (ref 0–44)
AST: 12 U/L — ABNORMAL LOW (ref 15–41)
Albumin: 2.5 g/dL — ABNORMAL LOW (ref 3.5–5.0)
Alkaline Phosphatase: 157 U/L — ABNORMAL HIGH (ref 38–126)
Anion gap: 8 (ref 5–15)
BUN: <5 mg/dL — ABNORMAL LOW (ref 6–20)
CO2: 21 mmol/L — ABNORMAL LOW (ref 22–32)
Calcium: 8.2 mg/dL — ABNORMAL LOW (ref 8.9–10.3)
Chloride: 110 mmol/L (ref 98–111)
Creatinine, Ser: 0.48 mg/dL (ref 0.44–1.00)
GFR calc Af Amer: >60 mL/min (ref >60)
GFR calc non Af Amer: >60 mL/min (ref >60)
Glucose, Bld: 81 mg/dL (ref 70–99)
Potassium: 3.4 mmol/L — ABNORMAL LOW (ref 3.5–5.1)
Sodium: 139 mmol/L (ref 135–145)
Total Bilirubin: 0.6 mg/dL (ref 0.3–1.2)
Total Protein: 6 g/dL — ABNORMAL LOW (ref 6.5–8.1)

## 2019-08-03 LAB — SARS CORONAVIRUS 2 BY RT PCR (HOSPITAL ORDER, PERFORMED IN ~~LOC~~ HOSPITAL LAB): SARS Coronavirus 2: NEGATIVE

## 2019-08-03 LAB — TYPE AND SCREEN
ABO/RH(D): A NEG
Antibody Screen: NEGATIVE

## 2019-08-03 LAB — PROTEIN / CREATININE RATIO, URINE
Creatinine, Urine: 61.49 mg/dL
Protein Creatinine Ratio: 0.2 mg/mg{Cre} — ABNORMAL HIGH (ref 0.00–0.15)
Total Protein, Urine: 12 mg/dL

## 2019-08-03 MED ORDER — OXYTOCIN-SODIUM CHLORIDE 30-0.9 UT/500ML-% IV SOLN
1.0000 m[IU]/min | INTRAVENOUS | Status: DC
  Administered 2019-08-03: 2 m[IU]/min via INTRAVENOUS
  Filled 2019-08-03: qty 500

## 2019-08-03 MED ORDER — LACTATED RINGERS IV SOLN: INTRAVENOUS | Status: DC

## 2019-08-03 MED ORDER — LACTATED RINGERS IV SOLN
500.0000 mL | INTRAVENOUS | Status: DC | PRN
  Administered 2019-08-04: 500 mL via INTRAVENOUS

## 2019-08-03 MED ORDER — TERBUTALINE SULFATE 1 MG/ML IJ SOLN: 0.2500 mg | Freq: Once | INTRAMUSCULAR | Status: DC | PRN

## 2019-08-03 MED ORDER — EPHEDRINE 5 MG/ML INJ: 10.0000 mg | INTRAVENOUS | Status: DC | PRN

## 2019-08-03 MED ORDER — ACETAMINOPHEN 325 MG PO TABS: 650.0000 mg | ORAL_TABLET | ORAL | Status: DC | PRN

## 2019-08-03 MED ORDER — PHENYLEPHRINE 40 MCG/ML (10ML) SYRINGE FOR IV PUSH (FOR BLOOD PRESSURE SUPPORT)
80.0000 ug | PREFILLED_SYRINGE | INTRAVENOUS | Status: DC | PRN
  Filled 2019-08-03: qty 10

## 2019-08-03 MED ORDER — FENTANYL CITRATE (PF) 100 MCG/2ML IJ SOLN: 50.0000 ug | INTRAMUSCULAR | Status: DC | PRN

## 2019-08-03 MED ORDER — DIPHENHYDRAMINE HCL 50 MG/ML IJ SOLN: 12.5000 mg | INTRAMUSCULAR | Status: DC | PRN

## 2019-08-03 MED ORDER — OXYTOCIN BOLUS FROM INFUSION
333.0000 mL | Freq: Once | INTRAVENOUS | Status: AC
  Administered 2019-08-04: 333 mL via INTRAVENOUS

## 2019-08-03 MED ORDER — ONDANSETRON HCL 4 MG/2ML IJ SOLN: 4.0000 mg | Freq: Four times a day (QID) | INTRAMUSCULAR | Status: DC | PRN

## 2019-08-03 MED ORDER — PHENYLEPHRINE 40 MCG/ML (10ML) SYRINGE FOR IV PUSH (FOR BLOOD PRESSURE SUPPORT)
80.0000 ug | PREFILLED_SYRINGE | INTRAVENOUS | Status: DC | PRN
  Administered 2019-08-04 (×2): 80 ug via INTRAVENOUS

## 2019-08-03 MED ORDER — ACETAMINOPHEN 500 MG PO TABS
1000.0000 mg | ORAL_TABLET | Freq: Once | ORAL | Status: AC
  Administered 2019-08-03: 1000 mg via ORAL
  Filled 2019-08-03: qty 2

## 2019-08-03 MED ORDER — LIDOCAINE HCL (PF) 1 % IJ SOLN
INTRAMUSCULAR | Status: DC | PRN
  Administered 2019-08-03 (×2): 6 mL via EPIDURAL

## 2019-08-03 MED ORDER — SOD CITRATE-CITRIC ACID 500-334 MG/5ML PO SOLN: 30.0000 mL | ORAL | Status: DC | PRN

## 2019-08-03 MED ORDER — OXYTOCIN-SODIUM CHLORIDE 30-0.9 UT/500ML-% IV SOLN: 2.5000 [IU]/h | INTRAVENOUS | Status: DC

## 2019-08-03 MED ORDER — LACTATED RINGERS IV SOLN
500.0000 mL | Freq: Once | INTRAVENOUS | Status: AC
  Administered 2019-08-03: 500 mL via INTRAVENOUS

## 2019-08-03 MED ORDER — SODIUM CHLORIDE (PF) 0.9 % IJ SOLN
INTRAMUSCULAR | Status: DC | PRN
  Administered 2019-08-03: 12 mL/h via EPIDURAL

## 2019-08-03 MED ORDER — LIDOCAINE HCL (PF) 1 % IJ SOLN: 30.0000 mL | INTRAMUSCULAR | Status: DC | PRN

## 2019-08-03 MED ORDER — FENTANYL-BUPIVACAINE-NACL 0.5-0.125-0.9 MG/250ML-% EP SOLN
12.0000 mL/h | EPIDURAL | Status: DC | PRN
  Filled 2019-08-03: qty 250

## 2019-08-03 NOTE — H&P (Addendum)
Jenna Mendoza is a 29 y.o. female [phone] [redacted]w[redacted]d presented to MAU for persistent headache and floaters in her vision. In MAU, headache improved from 4/10 to 2/10 with tylenol but continued to have floaters. In addition late deceleration noted on monitoring. Given this admitted for IOL.   She reports no LOF, VB, contractions. Reports normal FM.  Pregnancy c/b: 1. Increasing HA: around 28w noted Rt. facial tingling and daily headaches. Referred to Neurology but first available appointment after EDD  2. Depression: and history of PPD, restarted wellbutrin during pregnancy but has not been taking regularly.  3. Rh neg, s/p rhogam 05/22/2019 4. Obesity BMI at new OB 32.6 5. 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 Desires permanent sterilization: if CS desires BTL otherwise planning on interval salpingectomy. Previously counseled on LARCs, certain on permanent sterilization.    OB History    Gravida  5   Para  3   Term  3   Preterm      AB  1   Living  3     SAB  1   TAB      Ectopic      Multiple  0   Live Births  3          Past Medical History:  Diagnosis Date  . Allergy    shellfish  . Degenerative joint disease 2008  . Postpartum care following vaginal delivery (11/6) 11/21/2015  . Synovial chondromatoses   . Urinary tract infection during pregnancy mid december 2012   Past Surgical History:  Procedure Laterality Date  . SHOULDER SURGERY Right    Family History: family history includes Bipolar disorder in her mother; Breast cancer (age of onset: 59) in her mother; Breast cancer (age of onset: 71) in her maternal grandmother; Diabetes Mellitus II in her paternal grandfather; Epilepsy in her brother; Hypertension in her father; Schizophrenia in her mother. Social History:  reports that she has never smoked. She has never used smokeless tobacco. She reports previous alcohol use of about 1.0 standard drink of alcohol per week. She reports that she does not use drugs.     Maternal Diabetes: No Genetic Screening: Normal - Panorama NIPT low risk Maternal Ultrasounds/Referrals: Normal. Anterior placenta. EFW 07/21/2019 86.4%, AC 93.6% Fetal Ultrasounds or other Referrals:  None Maternal Substance Abuse:  No Significant Maternal Medications:  Meds include: Other: Wellbutrin Significant Maternal Lab Results:  Group B Strep negative Other Comments:  None  Review of Systems Per HPI Exam Physical Exam  Dilation: 3.5 Effacement (%): 70 Station: -2, -3 Exam by:: Holly Flippin RN  Blood pressure (!) 144/86, pulse 84, temperature 97.8 F (36.6 C), temperature source Oral, resp. rate 16, height 5\' 7"  (1.702 m), weight 96.1 kg, SpO2 96 %, unknown if currently breastfeeding. NAD, resting comfortably, uncomfortable with contractions Gravid abdomen Fetal testing: Earlier shallow late decel, otherwise FHR 130, +accels, moderate variability, no other decels, overall Cat 1  Toco q87m Prenatal labs: ABO, Rh:    Antibody:   Rubella: Immune (01/06 0000) RPR: Nonreactive (05/07 0000)  HBsAg: Negative (01/06 0000)  HIV: nonreactive (01/06 0000)  GBS: Negative/-- (07/02 0000)   Recent Labs    08/01/19 0125 08/03/19 1215  WBC 7.8 6.2  HGB 12.9 12.4  HCT 40.1 38.0  PLT 199 188  NA 136 139  K 3.6 3.4*  CL 106 110  BUN 6 <5*  CREATININE 0.50 0.48  AST 16 12*  ALT 14 14  BILITOT 0.5 0.6  UPC 0.20   Assessment/Plan: SHAMELA HAYDON 29 y.o. [id number] at [redacted]w[redacted]d admitted for augmentation of latent labor in setting of late decel 1. IOL: On admit 3.5/70/-2, augment with pitocin/AROM prn 2. Fetal monitoring: Late deceleration, with reassuring testing and BPP, continue to closely monitor fetal status 3. HA/Vision changes: 1 elevated BP, Headache improved with 1g tylenol, CBC/CMP/UPC wnl, continue to monitor for preeclampsia 4. Depression: and history of PPD, restarted wellbutrin during pregnancy but has not been taking regularly due to N/V. Encouraged to resume PP 5.  Rh neg, s/p rhogam 05/22/2019, PP pending baby 6. Desires permanent sterilization: if CS desires BTL otherwise planning on interval salpingectomy.   Yuriko Portales K Taam-Akelman 08/03/2019, 5:06 PM

## 2019-08-03 NOTE — MAU Provider Note (Signed)
History     CSN: [id number]  Arrival date and time: 08/03/19 1050   First Provider Initiated Contact with Patient 08/03/19 1150      Chief Complaint  Patient presents with  . Hypertension   Jenna Mendoza is a 29 y.o. [phone] at [redacted]w[redacted]d who presents to MAU for preeclampsia evaluation after she experienced a headache last night that went away with Tylenol. Patient was seen in MAU on 08/01/2019 and was told she had an elevated pressure when she experienced a BP of 130s/80s. Labs were performed and she was found to have an elevated PCr. Patient is unsure if she had this lab done earlier in pregnancy. Patient reports she did have gHTN in a previous pregnancy.  Patient endorses a HA at this time that she rates 4/10. Patient reports she took Tylenol last night around 8PM, but did not take any this morning. Patient reports she has experienced HA throughout pregnancy, but reports they usually go away much more quickly and with only drinking water, which is not the case with her recent headaches. Patient also endorses randomly occurring black spots in her vision, which she reports she can "blink away", but sometimes when she looks at her phone her text message appear blurry. The vision changes have been going on since last night.   Pt denies N/V, epigastric pain, swelling in face and hands, sudden weight gain. Pt denies chest pain and SOB.  Pt denies constipation, diarrhea, or urinary problems. Pt denies fever, chills, fatigue, sweating or changes in appetite. Pt denies dizziness, light-headedness, weakness.  Pt denies VB, ctx, LOF and reports good FM.  Current pregnancy problems? Hx of gHTN Blood Type? A NEGATIVE Allergies? Shellfish Current medications? PNVs Current [facility] & next appt? [facility] [place] Wednesday 08/05/2019   OB History    Gravida  5   Para  3   Term  3   Preterm      AB  1   Living  3     SAB  1   TAB      Ectopic      Multiple  0   Live Births  3            Past Medical History:  Diagnosis Date  . Allergy    shellfish  . Degenerative joint disease 2008  . Postpartum care following vaginal delivery (11/6) 11/21/2015  . Synovial chondromatoses   . Urinary tract infection during pregnancy mid december 2012    Past Surgical History:  Procedure Laterality Date  . SHOULDER SURGERY Right     Family History  Problem Relation Age of Onset  . Diabetes Mellitus II Paternal Grandfather   . Hypertension Father   . Breast cancer Maternal Grandmother 60  . Schizophrenia Mother   . Bipolar disorder Mother   . Breast cancer Mother 16  . Epilepsy Brother   . Anesthesia problems Neg Hx   . Hypotension Neg Hx   . Malignant hyperthermia Neg Hx   . Pseudochol deficiency Neg Hx     Social History   Tobacco Use  . Smoking status: Never Smoker  . Smokeless tobacco: Never Used  Vaping Use  . Vaping Use: Never used  Substance Use Topics  . Alcohol use: Not Currently    Alcohol/week: 1.0 standard drink    Types: 1 Standard drinks or equivalent per week    Comment: not while preg  . Drug use: No    Allergies:  Allergies  Allergen Reactions  .  Shellfish Allergy Anaphylaxis and Swelling    Medications Prior to Admission  Medication Sig Dispense Refill Last Dose  . buPROPion HCl (WELLBUTRIN PO) Take 50 mg by mouth daily.   Past Month at Unknown time  . Prenatal Vit-Fe Fumarate-FA (PRENATAL MULTIVITAMIN) TABS tablet Take 1 tablet by mouth daily at 12 noon.   08/03/2019 at Unknown time    Review of Systems  Constitutional: Negative for chills, diaphoresis, fatigue and fever.  Eyes: Positive for visual disturbance.  Respiratory: Negative for shortness of breath.   Cardiovascular: Negative for chest pain.  Gastrointestinal: Negative for abdominal pain, constipation, diarrhea, nausea and vomiting.  Genitourinary: Negative for dysuria, flank pain, frequency, pelvic pain, urgency, vaginal bleeding and vaginal discharge.   Neurological: Positive for headaches. Negative for dizziness, weakness and light-headedness.    Physical Exam   Blood pressure 129/84, pulse 89, temperature 97.8 F (36.6 C), temperature source Oral, resp. rate 16, weight 96.1 kg, SpO2 96 %, unknown if currently breastfeeding.  Patient Vitals for the past 24 hrs:  BP Temp Temp src Pulse Resp SpO2 Weight  08/03/19 1616 129/84 -- -- 89 -- -- --  08/03/19 1601 119/70 -- -- 74 -- -- --  08/03/19 1531 127/74 -- -- 88 -- -- --  08/03/19 1516 115/68 -- -- 77 -- -- --  08/03/19 1501 119/66 -- -- 99 -- -- --  08/03/19 1446 121/76 -- -- 80 -- -- --  08/03/19 1416 123/80 -- -- 86 -- -- --  08/03/19 1401 119/83 -- -- 87 -- -- --  08/03/19 1346 126/83 -- -- 77 -- -- --  08/03/19 1331 129/84 -- -- 86 -- -- --  08/03/19 1318 131/85 -- -- 72 -- -- --  08/03/19 1246 (!) 109/55 -- -- 83 -- -- --  08/03/19 1231 107/60 -- -- 87 -- -- --  08/03/19 1216 112/67 -- -- 83 -- -- --  08/03/19 1201 114/66 -- -- 91 -- -- --  08/03/19 1147 112/67 -- -- 92 -- -- --  08/03/19 1121 104/76 -- -- (!) 106 -- -- --  08/03/19 1105 (!) 144/84 97.8 F (36.6 C) Oral -- 16 96 % 96.1 kg   Physical Exam Constitutional:      General: She is not in acute distress.    Appearance: She is well-developed. She is not diaphoretic.  HENT:     Head: Normocephalic and atraumatic.  Pulmonary:     Effort: Pulmonary effort is normal.  Genitourinary:    Vagina: No vaginal discharge.  Neurological:     Mental Status: She is alert and oriented to person, place, and time.  Psychiatric:        Behavior: Behavior normal.        Thought Content: Thought content normal.        Judgment: Judgment normal.    Results for orders placed or performed during the hospital encounter of 08/03/19 (from the past 24 hour(s))  Protein / creatinine ratio, urine     Status: Abnormal   Collection Time: 08/03/19 11:27 AM  Result Value Ref Range   Creatinine, Urine 61.49 mg/dL   Total Protein,  Urine 12 mg/dL   Protein Creatinine Ratio 0.20 (H) 0.00 - 0.15 mg/mg[Cre]  Urinalysis, Routine w reflex microscopic     Status: Abnormal   Collection Time: 08/03/19 11:38 AM  Result Value Ref Range   Color, Urine YELLOW YELLOW   APPearance CLEAR CLEAR   Specific Gravity, Urine 1.010 1.005 - 1.030   pH  6.0 5.0 - 8.0   Glucose, UA 50 (A) NEGATIVE mg/dL   Hgb urine dipstick NEGATIVE NEGATIVE   Bilirubin Urine NEGATIVE NEGATIVE   Ketones, ur NEGATIVE NEGATIVE mg/dL   Protein, ur NEGATIVE NEGATIVE mg/dL   Nitrite NEGATIVE NEGATIVE   Leukocytes,Ua TRACE (A) NEGATIVE   RBC / HPF 0-5 0 - 5 RBC/hpf   WBC, UA 6-10 0 - 5 WBC/hpf   Bacteria, UA NONE SEEN NONE SEEN   Squamous Epithelial / LPF 0-5 0 - 5   Mucus PRESENT   CBC     Status: None   Collection Time: 08/03/19 12:15 PM  Result Value Ref Range   WBC 6.2 4.0 - 10.5 K/uL   RBC 4.22 3.87 - 5.11 MIL/uL   Hemoglobin 12.4 12.0 - 15.0 g/dL   HCT 38.0 36 - 46 %   MCV 90.0 80.0 - 100.0 fL   MCH 29.4 26.0 - 34.0 pg   MCHC 32.6 30.0 - 36.0 g/dL   RDW 13.4 11.5 - 15.5 %   Platelets 188 150 - 400 K/uL   nRBC 0.0 0.0 - 0.2 %  Comprehensive metabolic panel     Status: Abnormal   Collection Time: 08/03/19 12:15 PM  Result Value Ref Range   Sodium 139 135 - 145 mmol/L   Potassium 3.4 (L) 3.5 - 5.1 mmol/L   Chloride 110 98 - 111 mmol/L   CO2 21 (L) 22 - 32 mmol/L   Glucose, Bld 81 70 - 99 mg/dL   BUN <5 (L) 6 - 20 mg/dL   Creatinine, Ser 0.48 0.44 - 1.00 mg/dL   Calcium 8.2 (L) 8.9 - 10.3 mg/dL   Total Protein 6.0 (L) 6.5 - 8.1 g/dL   Albumin 2.5 (L) 3.5 - 5.0 g/dL   AST 12 (L) 15 - 41 U/L   ALT 14 0 - 44 U/L   Alkaline Phosphatase 157 (H) 38 - 126 U/L   Total Bilirubin 0.6 0.3 - 1.2 mg/dL   GFR calc non Af Amer >60 >60 mL/min   GFR calc Af Amer >60 >60 mL/min   Anion gap 8 5 - 15    MAU Course  Procedures  MDM -preeclampsia evaluation without severe range BP in MAU on admission -elevated BP of 144/84 followed by normal  pressures -symptoms include: HA/vision changes; Tylenol given -UA: 50GLU/trace leuks -CBC: H/H 12.4/38.0, platelets 188 (were 199 two days ago) -CMP: serum creatinine 0.48, AST/ALT 12/14, K 3.4 -PCr: 0.20 (unchanged from two days ago) -EFM: reactive with single late       -baseline: 135       -variability: moderate       -accels: present, 15x15       -decels: late @1140AM        -TOCO: few, mild ctx, pt not feeling -BPP: 8/8, AFI 6cm, VTX -after medication administration, pt reports HA now 2/10 and pt is still experiencing visual disturbance -consulted with Dr. Elgie Congo, who recommends that the patient be induced today -called Dr. Wendi Snipes who agrees with plan and will enter admission orders -admit to L&D for induction  Orders Placed This Encounter  Procedures  . SARS Coronavirus 2 by RT PCR (hospital order, performed in [facility] hospital lab) Nasopharyngeal Nasopharyngeal Swab    Standing Status:   Standing    Number of Occurrences:   1    Order Specific Question:   Is this test for diagnosis or screening    Answer:   Screening    Order Specific Question:  Symptomatic for COVID-19 as defined by CDC    Answer:   No    Order Specific Question:   Hospitalized for COVID-19    Answer:   No    Order Specific Question:   Admitted to ICU for COVID-19    Answer:   No    Order Specific Question:   Previously tested for COVID-19    Answer:   Yes    Order Specific Question:   Resident in a congregate (group) care setting    Answer:   No    Order Specific Question:   Employed in healthcare setting    Answer:   No    Order Specific Question:   Pregnant    Answer:   Yes    Order Specific Question:   Has patient completed COVID vaccination(s) (2 doses of Pfizer/Moderna 1 dose of [employer])    Answer:   Unknown  . [place] MFM FETAL BPP WO NON STRESS    Standing Status:   Standing    Number of Occurrences:   1    Order Specific Question:   Symptom/Reason for Exam    Answer:   Fetal heart  rate decelerations affecting management of mother [401027]  . CBC    Standing Status:   Standing    Number of Occurrences:   1  . Comprehensive metabolic panel    Standing Status:   Standing    Number of Occurrences:   1  . Protein / creatinine ratio, urine    Standing Status:   Standing    Number of Occurrences:   1  . Urinalysis, Routine w reflex microscopic    Standing Status:   Standing    Number of Occurrences:   1   Meds ordered this encounter  Medications  . acetaminophen (TYLENOL) tablet 1,000 mg   Assessment and Plan   1. Visual disturbance   2. Fetal heart rate decelerations affecting management of mother   3. Elevated blood pressure reading without diagnosis of hypertension   4. [redacted] weeks gestation of pregnancy   5. NST (non-stress test) reactive   6. Pregnancy headache in third trimester     -admit to L&D for induction  Gerrie Nordmann Janeliz Prestwood 08/03/2019, 4:26 PM

## 2019-08-03 NOTE — Anesthesia Preprocedure Evaluation (Signed)
Anesthesia Evaluation  Patient identified by MRN, date of birth, ID band Patient awake    Reviewed: Allergy & Precautions, H&P , NPO status , Patient's Chart, lab work & pertinent test results  Airway Mallampati: II  TM Distance: >3 FB Neck ROM: full    Dental no notable dental hx.    Pulmonary neg pulmonary ROS,    Pulmonary exam normal breath sounds clear to auscultation       Cardiovascular negative cardio ROS Normal cardiovascular exam Rhythm:regular Rate:Normal     Neuro/Psych negative neurological ROS  negative psych ROS   GI/Hepatic negative GI ROS, Neg liver ROS,   Endo/Other  negative endocrine ROS  Renal/GU negative Renal ROS  negative genitourinary   Musculoskeletal   Abdominal (+) + obese,   Peds  Hematology negative hematology ROS (+)   Anesthesia Other Findings   Reproductive/Obstetrics (+) Pregnancy                             Anesthesia Physical Anesthesia Plan  ASA: II  Anesthesia Plan: Epidural   Post-op Pain Management:    Induction:   PONV Risk Score and Plan:   Airway Management Planned:   Additional Equipment:   Intra-op Plan:   Post-operative Plan:   Informed Consent: I have reviewed the patients History and Physical, chart, labs and discussed the procedure including the risks, benefits and alternatives for the proposed anesthesia with the patient or authorized representative who has indicated his/her understanding and acceptance.       Plan Discussed with:   Anesthesia Plan Comments:         Anesthesia Quick Evaluation

## 2019-08-03 NOTE — Anesthesia Procedure Notes (Signed)
Epidural Patient location during procedure: OB Start time: 08/03/2019 11:25 PM End time: 08/03/2019 11:27 PM  Staffing Anesthesiologist: Lyn Hollingshead, MD Performed: anesthesiologist   Preanesthetic Checklist Completed: patient identified, IV checked, site marked, risks and benefits discussed, surgical consent, monitors and equipment checked, pre-op evaluation and timeout performed  Epidural Patient position: sitting Prep: DuraPrep and site prepped and draped Patient monitoring: continuous pulse ox and blood pressure Approach: midline Location: L3-L4 Injection technique: LOR air  Needle:  Needle type: Tuohy  Needle gauge: 17 G Needle length: 9 cm and 9 Needle insertion depth: 5 cm cm Catheter type: closed end flexible Catheter size: 19 Gauge Catheter at skin depth: 10 cm Test dose: negative and Other  Assessment Events: blood not aspirated, injection not painful, no injection resistance, no paresthesia and negative IV test  Additional Notes Reason for block:procedure for pain

## 2019-08-03 NOTE — Progress Notes (Signed)
OBGYN Reports HA and floaters improving Vitals:   08/03/19 1647 08/03/19 1702 08/03/19 1800 08/03/19 1834  BP: (!) 144/86   (!) 144/80  Pulse: 84   76  Resp:   18 16  Temp:    98.8 F (37.1 C)  TempSrc:    Oral  SpO2:      Weight:  96.1 kg    Height:  5\' 7"  (1.702 m)    SVE 3.5/50/-2, AROM c/f FHR 140, Cat 1 Toco q3-82m -Augment with pitocin prn -Meets criteria for GHTN by BP 140s >4 hours apart Ellizabeth Dacruz K Taam-Akelman 08/03/19 6:49 PM

## 2019-08-03 NOTE — MAU Note (Signed)
Pt states the Tylenol helped her HA, brought it down to a 2/10 but reports she is still seeing floaters in her vision.

## 2019-08-03 NOTE — MAU Note (Signed)
Was seen on Friday for rule out rupture of membranes and BP was elevated.  Some labs were also elevated.  Told to come back if having HA or visual changes.  Has had HA since yesterday that went away with Tylenol, but came back.  Also seeing "black dots" and having some blurred vision.  Dr advised to be seen.  Reports good fetal movement.

## 2019-08-04 ENCOUNTER — Encounter (HOSPITAL_COMMUNITY): Payer: Self-pay | Admitting: Obstetrics & Gynecology

## 2019-08-04 LAB — CBC
HCT: 36.5 % (ref 36.0–46.0)
Hemoglobin: 12 g/dL (ref 12.0–15.0)
MCH: 29.5 pg (ref 26.0–34.0)
MCHC: 32.9 g/dL (ref 30.0–36.0)
MCV: 89.7 fL (ref 80.0–100.0)
Platelets: 152 10*3/uL (ref 150–400)
RBC: 4.07 MIL/uL (ref 3.87–5.11)
RDW: 13.3 % (ref 11.5–15.5)
WBC: 8.9 10*3/uL (ref 4.0–10.5)
nRBC: 0 % (ref 0.0–0.2)

## 2019-08-04 LAB — RPR: RPR Ser Ql: NONREACTIVE

## 2019-08-04 MED ORDER — SODIUM CHLORIDE 0.9% FLUSH
3.0000 mL | Freq: Two times a day (BID) | INTRAVENOUS | Status: DC
  Administered 2019-08-04: 3 mL via INTRAVENOUS

## 2019-08-04 MED ORDER — COCONUT OIL OIL: 1.0000 "application " | TOPICAL_OIL | Status: DC | PRN

## 2019-08-04 MED ORDER — ACETAMINOPHEN 325 MG PO TABS
650.0000 mg | ORAL_TABLET | ORAL | Status: DC | PRN
  Administered 2019-08-04: 650 mg via ORAL
  Filled 2019-08-04: qty 2

## 2019-08-04 MED ORDER — DIBUCAINE (PERIANAL) 1 % EX OINT: 1.0000 "application " | TOPICAL_OINTMENT | CUTANEOUS | Status: DC | PRN

## 2019-08-04 MED ORDER — OXYCODONE HCL 5 MG PO TABS
5.0000 mg | ORAL_TABLET | ORAL | Status: DC | PRN
  Administered 2019-08-04: 5 mg via ORAL
  Filled 2019-08-04: qty 1

## 2019-08-04 MED ORDER — DOCUSATE SODIUM 100 MG PO CAPS
100.0000 mg | ORAL_CAPSULE | Freq: Two times a day (BID) | ORAL | Status: DC
  Administered 2019-08-04 – 2019-08-05 (×2): 100 mg via ORAL
  Filled 2019-08-04 (×2): qty 1

## 2019-08-04 MED ORDER — PRENATAL MULTIVITAMIN CH
1.0000 | ORAL_TABLET | Freq: Every day | ORAL | Status: DC
  Administered 2019-08-04 – 2019-08-05 (×2): 1 via ORAL
  Filled 2019-08-04 (×2): qty 1

## 2019-08-04 MED ORDER — IBUPROFEN 600 MG PO TABS
600.0000 mg | ORAL_TABLET | Freq: Four times a day (QID) | ORAL | Status: DC
  Administered 2019-08-04 – 2019-08-05 (×6): 600 mg via ORAL
  Filled 2019-08-04 (×6): qty 1

## 2019-08-04 MED ORDER — BENZOCAINE-MENTHOL 20-0.5 % EX AERO
1.0000 "application " | INHALATION_SPRAY | CUTANEOUS | Status: DC | PRN
  Administered 2019-08-04: 1 via TOPICAL

## 2019-08-04 MED ORDER — SODIUM CHLORIDE 0.9% FLUSH: 3.0000 mL | INTRAVENOUS | Status: DC | PRN

## 2019-08-04 MED ORDER — MISOPROSTOL 200 MCG PO TABS
ORAL_TABLET | ORAL | Status: AC
  Administered 2019-08-04: 800 ug via RECTAL
  Filled 2019-08-04: qty 4

## 2019-08-04 MED ORDER — MISOPROSTOL 200 MCG PO TABS: 800.0000 ug | ORAL_TABLET | Freq: Once | ORAL | Status: AC

## 2019-08-04 MED ORDER — ONDANSETRON HCL 4 MG/2ML IJ SOLN: 4.0000 mg | INTRAMUSCULAR | Status: DC | PRN

## 2019-08-04 MED ORDER — RHO D IMMUNE GLOBULIN 1500 UNIT/2ML IJ SOSY
300.0000 ug | PREFILLED_SYRINGE | Freq: Once | INTRAMUSCULAR | Status: AC
  Administered 2019-08-04: 300 ug via INTRAVENOUS
  Filled 2019-08-04: qty 2

## 2019-08-04 MED ORDER — WITCH HAZEL-GLYCERIN EX PADS
1.0000 "application " | MEDICATED_PAD | CUTANEOUS | Status: DC | PRN
  Administered 2019-08-05: 1 via TOPICAL

## 2019-08-04 MED ORDER — OXYCODONE HCL 5 MG PO TABS: 10.0000 mg | ORAL_TABLET | ORAL | Status: DC | PRN

## 2019-08-04 MED ORDER — DIPHENHYDRAMINE HCL 25 MG PO CAPS: 25.0000 mg | ORAL_CAPSULE | Freq: Four times a day (QID) | ORAL | Status: DC | PRN

## 2019-08-04 MED ORDER — BUPROPION HCL ER (XL) 150 MG PO TB24
150.0000 mg | ORAL_TABLET | Freq: Every day | ORAL | Status: DC
  Administered 2019-08-04 – 2019-08-05 (×2): 150 mg via ORAL
  Filled 2019-08-04 (×2): qty 1

## 2019-08-04 MED ORDER — SIMETHICONE 80 MG PO CHEW: 80.0000 mg | CHEWABLE_TABLET | ORAL | Status: DC | PRN

## 2019-08-04 MED ORDER — SODIUM CHLORIDE 0.9 % IV SOLN: 250.0000 mL | INTRAVENOUS | Status: DC | PRN

## 2019-08-04 MED ORDER — ONDANSETRON HCL 4 MG PO TABS: 4.0000 mg | ORAL_TABLET | ORAL | Status: DC | PRN

## 2019-08-04 NOTE — Progress Notes (Signed)
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  CSW met with MOB to offer support and complete assessment.    CSW congratulated MOB on the birth of infant. CSW advised MOB of CSW's role and the reason for CSW checking in with her. MOB reported that she was diagnosed with PPD after the birth of her last child. MOB reported that she was told in May 2018 that PPD was occurring. MOB reported that she was placed on Wellbutrin in which MOB reports that she is still taking. MOB reports that she seen a therapist in the past for other reasons but declines the need for a therapist at this time. MOB reported no SI or HI and reports no DV to this CSW   MOB reports that her supports are her spouse as well as other family members. MOB reported that she has all needed items to care for infant with no other needs.   CSW provided education regarding the baby blues period vs. perinatal mood disorders, discussed treatment .CSW provided review of Sudden Infant Death Syndrome (SIDS) precautions.   CSW identifies no further need for intervention and no barriers to discharge at this time.    S. , MSW, LCSW [facility] [phone]   

## 2019-08-04 NOTE — Anesthesia Postprocedure Evaluation (Signed)
Anesthesia Post Note  Patient: Jenna Mendoza  Procedure(s) Performed: AN AD HOC LABOR EPIDURAL     Patient location during evaluation: Mother Baby Anesthesia Type: Epidural Level of consciousness: awake and alert Pain management: pain level controlled Vital Signs Assessment: post-procedure vital signs reviewed and stable Respiratory status: spontaneous breathing, nonlabored ventilation and respiratory function stable Cardiovascular status: stable Postop Assessment: no headache, no backache and epidural receding Anesthetic complications: no   No complications documented.  Last Vitals:  Vitals:   08/04/19 0416 08/04/19 0730  BP: 117/78 110/72  Pulse: 71 74  Resp: 16 18  Temp: 36.7 C 36.7 C  SpO2: 100% 99%    Last Pain:  Vitals:   08/04/19 0730  TempSrc: Oral  PainSc:    Pain Goal:                   Riki Sheer

## 2019-08-04 NOTE — Progress Notes (Signed)
PPD# 0 Pt without complaints. Lochia- mild Would like circ however baby has not voided yet VSSAF Hgb- 12.0 Imp/ Stable Plan/Routine care

## 2019-08-05 LAB — RH IG WORKUP (INCLUDES ABO/RH)
ABO/RH(D): A NEG
Fetal Screen: NEGATIVE
Gestational Age(Wks): 38.6
Unit division: 0

## 2019-08-05 MED ORDER — OXYCODONE HCL 5 MG PO TABS: 5.0000 mg | ORAL_TABLET | Freq: Four times a day (QID) | ORAL | 0 refills | Status: DC | PRN

## 2019-08-05 NOTE — Progress Notes (Signed)
Patient is doing well.  She is ambulating, voiding, tolerating PO.  Pain control is good--just significant cramping w BF.  Lochia is appropriate  Vitals:   08/04/19 1200 08/04/19 1607 08/04/19 2140 08/05/19 0551  BP: 121/69 132/76 131/73 127/87  Pulse: 84 65 72 73  Resp: 18 18  18   Temp: 98 F (36.7 C) 98.1 F (36.7 C) 98.5 F (36.9 C) 98 F (36.7 C)  TempSrc: Oral Oral Oral Oral  SpO2: 98% 100% 98% 100%  Weight:      Height:        NAD Fundus firm Ext: trace edema b/l  Lab Results  Component Value Date   WBC 8.9 08/04/2019   HGB 12.0 08/04/2019   HCT 36.5 08/04/2019   MCV 89.7 08/04/2019   PLT 152 08/04/2019    --/--/A NEG (07/20 0524)/RImmune  A/P 29 y.o. [id number] PPD#1. Routine care.   Rh negative--s/p rhogam Meeting all goals.  Discharge to home today. Marland Kitchen    [place]

## 2019-08-05 NOTE — Progress Notes (Signed)
Reviewed discharge instructions with patient including medications, follow up appointment, signs/symptoms of pre-e, when to call MD/go to MAU. Patient asked appropriate questions and verbalized understanding of the discharge instructions.

## 2019-08-05 NOTE — Discharge Summary (Signed)
Postpartum Discharge Summary       Patient Name: Jenna Mendoza DOB: 1990-10-16 MRN: [id number]  Date of admission: 08/03/2019 Delivery date:08/04/2019  Delivering provider: Lyda Kalata K  Date of discharge: 08/05/2019  Admitting diagnosis: Non-reassuring fetal heart rate with late deceleration [O36.8390] Intrauterine pregnancy: [redacted]w[redacted]d     Secondary diagnosis:  Active Problems:   Non-reassuring fetal heart rate with late deceleration     Discharge diagnosis: Term Pregnancy Delivered                                              Post partum procedures:rhogam Augmentation: AROM and Pitocin Complications: None  Hospital course: Induction of Labor With Vaginal Delivery   29 y.o. yo [id number] at [redacted]w[redacted]d was admitted to the hospital 08/03/2019 for induction of labor.  Indication for induction: NRFHT.  Patient had an uncomplicated labor course as follows: Membrane Rupture Time/Date: 6:34 PM ,08/03/2019   Delivery Method:Vaginal, Spontaneous  Episiotomy: None  Lacerations:  None  Details of delivery can be found in separate delivery note.  Patient had a routine postpartum course. Patient is discharged home 08/05/19.  Newborn Data: Birth date:08/04/2019  Birth time:12:32 AM  Gender:Female  Living status:Living  Apgars:9 ,9  Weight:3671 g    Rhophylac:Yes   Physical exam  Vitals:   08/04/19 1200 08/04/19 1607 08/04/19 2140 08/05/19 0551  BP: 121/69 132/76 131/73 127/87  Pulse: 84 65 72 73  Resp: 18 18  18   Temp: 98 F (36.7 C) 98.1 F (36.7 C) 98.5 F (36.9 C) 98 F (36.7 C)  TempSrc: Oral Oral Oral Oral  SpO2: 98% 100% 98% 100%  Weight:      Height:       General: alert and cooperative Lochia: appropriate Uterine Fundus: firm DVT Evaluation: No evidence of DVT seen on physical exam. Labs: Lab Results  Component Value Date   WBC 8.9 08/04/2019   HGB 12.0 08/04/2019   HCT 36.5 08/04/2019   MCV 89.7 08/04/2019   PLT 152 08/04/2019   CMP Latest Ref Rng &  Units 08/03/2019  Glucose 70 - 99 mg/dL 81  BUN 6 - 20 mg/dL <5(L)  Creatinine 0.44 - 1.00 mg/dL 0.48  Sodium 135 - 145 mmol/L 139  Potassium 3.5 - 5.1 mmol/L 3.4(L)  Chloride 98 - 111 mmol/L 110  CO2 22 - 32 mmol/L 21(L)  Calcium 8.9 - 10.3 mg/dL 8.2(L)  Total Protein 6.5 - 8.1 g/dL 6.0(L)  Total Bilirubin 0.3 - 1.2 mg/dL 0.6  Alkaline Phos 38 - 126 U/L 157(H)  AST 15 - 41 U/L 12(L)  ALT 0 - 44 U/L 14   Edinburgh Score: Edinburgh Postnatal Depression Scale Screening Tool 08/04/2019  I have been able to laugh and see the funny side of things. 0  I have looked forward with enjoyment to things. 0  I have blamed myself unnecessarily when things went wrong. 0  I have been anxious or worried for no good reason. 0  I have felt scared or panicky for no good reason. 0  Things have been getting on top of me. 0  I have been so unhappy that I have had difficulty sleeping. 0  I have felt sad or miserable. 0  I have been so unhappy that I have been crying. 0  The thought of harming myself has occurred to me. 0  [name]  Postnatal Depression Scale Total 0      After visit meds:  Allergies as of 08/05/2019      Reactions   Shellfish Allergy Anaphylaxis, Swelling      Medication List    TAKE these medications   acetaminophen 500 MG tablet Commonly known as: TYLENOL Take 1,000 mg by mouth every 6 (six) hours as needed for mild pain or headache.   oxyCODONE 5 MG immediate release tablet Commonly known as: Oxy IR/ROXICODONE Take 1 tablet (5 mg total) by mouth every 6 (six) hours as needed (pain scale 4-7).   prenatal multivitamin Tabs tablet Take 1 tablet by mouth daily at 12 noon.        Discharge home in stable condition  Infant Disposition:home with mother Discharge instruction: per After Visit Summary and Postpartum booklet. Activity: Advance as tolerated. Pelvic rest for 6 weeks.  Diet: routine diet  Postpartum Appointment:4 weeks--h/o postpartum depression--patient  declines shorter interval follow up  Future Appointments:No future appointments. Follow up Visit:  Follow-up Information    Taam-Akelman, Lawrence Santiago, MD Follow up in 4 week(s).   Specialty: Obstetrics and Gynecology Contact information: [address] [place] [address] [phone]                   08/05/2019 [facility] GEFFEL Carlis Abbott, MD

## 2019-08-12 ENCOUNTER — Inpatient Hospital Stay (HOSPITAL_COMMUNITY): Admission: AD | Admit: 2019-08-12 | Source: Home / Self Care

## 2019-09-01 ENCOUNTER — Ambulatory Visit: Attending: Internal Medicine

## 2019-09-01 DIAGNOSIS — Z23 Encounter for immunization: Secondary | ICD-10-CM

## 2019-09-01 NOTE — Progress Notes (Signed)
   Covid-19 Vaccination Clinic  Name:  Jenna Mendoza    MRN: [id number] DOB: 1990/04/13  09/01/2019  [name] was observed post Covid-19 immunization for 15 minutes without incident. She was provided with Vaccine Information Sheet and instruction to access the V-Safe system.   [name] was instructed to call 911 with any severe reactions post vaccine: Marland Kitchen Difficulty breathing  . Swelling of face and throat  . A fast heartbeat  . A bad rash all over body  . Dizziness and weakness   Immunizations Administered    Name Date Dose VIS Date Route   Moderna COVID-19 Vaccine 09/01/2019 10:13 AM 0.5 mL 12/2018 Intramuscular   Manufacturer: Moderna   Lot: [id number]   [place]: [id number]

## 2019-10-15 ENCOUNTER — Ambulatory Visit

## 2019-12-09 ENCOUNTER — Encounter (HOSPITAL_BASED_OUTPATIENT_CLINIC_OR_DEPARTMENT_OTHER): Payer: Self-pay

## 2019-12-09 ENCOUNTER — Ambulatory Visit (HOSPITAL_BASED_OUTPATIENT_CLINIC_OR_DEPARTMENT_OTHER): Admit: 2019-12-09 | Admitting: Obstetrics & Gynecology

## 2019-12-09 SURGERY — SALPINGECTOMY, BILATERAL, LAPAROSCOPIC
Anesthesia: General | Laterality: Bilateral

## 2020-04-27 ENCOUNTER — Other Ambulatory Visit: Payer: Self-pay

## 2020-04-27 ENCOUNTER — Telehealth: Payer: Self-pay

## 2020-04-27 ENCOUNTER — Encounter: Payer: Self-pay | Admitting: Family Medicine

## 2020-04-27 ENCOUNTER — Ambulatory Visit (INDEPENDENT_AMBULATORY_CARE_PROVIDER_SITE_OTHER): Payer: BC Managed Care – PPO | Admitting: Family Medicine

## 2020-04-27 VITALS — BP 123/84 | HR 73 | Ht 67.0 in | Wt 199.0 lb

## 2020-04-27 DIAGNOSIS — E669 Obesity, unspecified: Secondary | ICD-10-CM | POA: Diagnosis not present

## 2020-04-27 MED ORDER — PHENTERMINE HCL 37.5 MG PO CAPS: 37.5000 mg | ORAL_CAPSULE | ORAL | 0 refills | Status: DC

## 2020-04-27 NOTE — Telephone Encounter (Signed)
Brittnae Luis Key: [id number] - PA Case ID: [id number] - Rx #: [id number] Need help? Call us at [phone]  Outcome Approvedtoday  Your PA request has been approved. Additional information will be provided in the approval communication. (Message 1145) Drug Phentermine HCl 37.5MG  capsules Form [employer] Electronic PA Form [phone] NCPDP) Original Claim Info [place] REQ-MD CALL [phone].[place] and patient informed

## 2020-04-27 NOTE — Telephone Encounter (Signed)
Laurence Orrego Key: [id number] - PA Case ID: [id number] - Rx #: [id number] Need help? Call us at [phone]  Status Sent to [employer]  Drug Phentermine HCl 37.5MG  capsules  Form [employer] Electronic PA Form [phone] NCPDP) Original Claim Info [place] REQ-MD CALL [phone].DRUG REQUIRES PRIOR AUTHORIZATION

## 2020-04-27 NOTE — Progress Notes (Signed)
BP 123/84   Pulse 73   Ht 5\' 7"  (1.702 m)   Wt 199 lb (90.3 kg)   SpO2 97%   BMI 31.17 kg/m    Subjective:   Patient ID: Jenna Mendoza, female    DOB: 07-17-1990, 30 y.o.   MRN: [id number]  HPI: Jenna Mendoza is a 30 y.o. female presenting on 04/27/2020 for Weight Gain (Would like medication to help loose weight)   HPI Obesity and weight management Patient obesity and weight management.  Would like to discuss medicines to help with weight.  She is struggling a little more recently.  She has been working on her own try to get it down.  And actually over the past 7 months she is down 12 pounds.  She would like to get down a little further.  She has been exercising every day except for on the weekend has been doing weight watchers and she was before she is here in the wall and needs help.  She had been previously on Saxenda and phentermine at times.  She would like to try another course of phentermine and then go back onto one of the injectables.  Relevant past medical, surgical, family and social history reviewed and updated as indicated. Interim medical history since our last visit reviewed. Allergies and medications reviewed and updated.  Review of Systems  Constitutional: Positive for unexpected weight change. Negative for activity change, appetite change, chills and fever.  Eyes: Negative for visual disturbance.  Respiratory: Negative for chest tightness and shortness of breath.   Cardiovascular: Negative for chest pain and leg swelling.  Musculoskeletal: Negative for back pain and gait problem.  Skin: Negative for rash.  Neurological: Negative for light-headedness and headaches.  Psychiatric/Behavioral: Negative for agitation and behavioral problems.  All other systems reviewed and are negative.   Per HPI unless specifically indicated above   Allergies as of 04/27/2020      Reactions   Shellfish Allergy Anaphylaxis, Swelling      Medication List       Accurate as  of April 27, 2020 10:33 AM. If you have any questions, ask your nurse or doctor.        STOP taking these medications   oxyCODONE 5 MG immediate release tablet Commonly known as: Oxy IR/ROXICODONE Stopped by: Fransisca Kaufmann Makih Stefanko, MD   prenatal multivitamin Tabs tablet Stopped by: Fransisca Kaufmann Desi Carby, MD     TAKE these medications   acetaminophen 500 MG tablet Commonly known as: TYLENOL Take 1,000 mg by mouth every 6 (six) hours as needed for mild pain or headache.   buPROPion 150 MG 24 hr tablet Commonly known as: WELLBUTRIN XL Take 1 tablet by mouth daily.   Mirena (52 MG) 20 MCG/24HR IUD Generic drug: levonorgestrel 1 Intra Uterine Device by Intrauterine route continuous as needed.   norethindrone-ethinyl estradiol 1-20 MG-MCG tablet Commonly known as: LOESTRIN Take 1 tablet by mouth daily.   phentermine 37.5 MG capsule Take 1 capsule (37.5 mg total) by mouth every morning. Started by: Fransisca Kaufmann Lidwina Kaner, MD        Objective:   BP 123/84   Pulse 73   Ht 5\' 7"  (1.702 m)   Wt 199 lb (90.3 kg)   SpO2 97%   BMI 31.17 kg/m   Wt Readings from Last 3 Encounters:  04/27/20 199 lb (90.3 kg)  08/03/19 211 lb 12.8 oz (96.1 kg)  11/26/18 205 lb 3.2 oz (93.1 kg)    Physical Exam Vitals  and nursing note reviewed.  Constitutional:      General: She is not in acute distress.    Appearance: Normal appearance. She is well-developed. She is not diaphoretic.  Eyes:     Conjunctiva/sclera: Conjunctivae normal.  Cardiovascular:     Rate and Rhythm: Normal rate and regular rhythm.     Heart sounds: Normal heart sounds. No murmur heard.   Pulmonary:     Effort: Pulmonary effort is normal. No respiratory distress.     Breath sounds: Normal breath sounds. No wheezing.  Skin:    General: Skin is warm and dry.     Findings: No rash.  Neurological:     Mental Status: She is alert and oriented to person, place, and time.     Coordination: Coordination normal.  Psychiatric:         Behavior: Behavior normal.       Assessment & Plan:   Problem List Items Addressed This Visit      Other   Obesity (BMI 30.0-34.9) - Primary   Relevant Medications   phentermine 37.5 MG capsule      Patient is having some anxiety as well but since restarting the phentermine we will hold off on adjusting the Wellbutrin but may need to in the future. Follow up plan: Return in about 4 weeks (around 05/25/2020), or if symptoms worsen or fail to improve, for Obesity and anxiety recheck.  Counseling provided for all of the vaccine components No orders of the defined types were placed in this encounter.   Caryl Pina, MD [facility] 04/27/2020, 10:33 AM

## 2020-05-25 ENCOUNTER — Ambulatory Visit (INDEPENDENT_AMBULATORY_CARE_PROVIDER_SITE_OTHER): Payer: BC Managed Care – PPO | Admitting: Family Medicine

## 2020-05-25 ENCOUNTER — Encounter: Payer: Self-pay | Admitting: Family Medicine

## 2020-05-25 ENCOUNTER — Other Ambulatory Visit: Payer: Self-pay

## 2020-05-25 VITALS — BP 126/78 | HR 78 | Ht 67.0 in | Wt 189.0 lb

## 2020-05-25 DIAGNOSIS — R399 Unspecified symptoms and signs involving the genitourinary system: Secondary | ICD-10-CM

## 2020-05-25 DIAGNOSIS — E669 Obesity, unspecified: Secondary | ICD-10-CM | POA: Diagnosis not present

## 2020-05-25 MED ORDER — PHENTERMINE HCL 37.5 MG PO CAPS: 37.5000 mg | ORAL_CAPSULE | ORAL | 0 refills | Status: DC

## 2020-05-25 NOTE — Progress Notes (Signed)
BP 126/78   Pulse 78   Ht 5\' 7"  (1.702 m)   Wt 189 lb (85.7 kg)   SpO2 98%   BMI 29.60 kg/m    Subjective:   Patient ID: [name], female    DOB: [birth year], 30 y.o.   MRN: [id number]  HPI: ADESUWA OSGOOD is a 30 y.o. female presenting on 05/25/2020 for Obesity (One month follow up. Wt. 199 in 04/27/20)   HPI Obesity and weight management follow-up. Patient is coming in for obesity and weight management follow-up and has been taking the phentermine and feels like she is doing well.  She lost 10 pounds in the past month.  GERD does not come in office fast she would like but she is feeling pretty good is going in the right direction.  She denies any side effects from the medication.  Dysuria and frequency Patient is starting to have some dysuria and frequency that she is notices over the past day or 2.  She has a little burning a little irritation when she urinates.  She denies any fevers or chills or flank pain or abdominal pain.  Relevant past medical, surgical, family and social history reviewed and updated as indicated. Interim medical history since our last visit reviewed. Allergies and medications reviewed and updated.  Review of Systems  Constitutional: Negative for chills and fever.  Eyes: Negative for visual disturbance.  Respiratory: Negative for chest tightness and shortness of breath.   Cardiovascular: Negative for chest pain and leg swelling.  Gastrointestinal: Negative for abdominal pain.  Genitourinary: Positive for dysuria and frequency. Negative for difficulty urinating, flank pain, hematuria, urgency, vaginal bleeding, vaginal discharge and vaginal pain.  Musculoskeletal: Negative for back pain and gait problem.  Skin: Negative for rash.  Neurological: Negative for light-headedness and headaches.  Psychiatric/Behavioral: Negative for agitation and behavioral problems.  All other systems reviewed and are negative.   Per HPI unless specifically indicated  above   Allergies as of 05/25/2020      Reactions   Shellfish Allergy Anaphylaxis, Swelling      Medication List       Accurate as of May 25, 2020  3:42 PM. If you have any questions, ask your nurse or doctor.        acetaminophen 500 MG tablet Commonly known as: TYLENOL Take 1,000 mg by mouth every 6 (six) hours as needed for mild pain or headache.   buPROPion 150 MG 24 hr tablet Commonly known as: WELLBUTRIN XL Take 1 tablet by mouth daily.   Mirena (52 MG) 20 MCG/DAY Iud Generic drug: levonorgestrel 1 Intra Uterine Device by Intrauterine route continuous as needed.   norethindrone-ethinyl estradiol 1-20 MG-MCG tablet Commonly known as: LOESTRIN Take 1 tablet by mouth daily.   phentermine 37.5 MG capsule Take 1 capsule (37.5 mg total) by mouth every morning.        Objective:   BP 126/78   Pulse 78   Ht 5\' 7"  (1.702 m)   Wt 189 lb (85.7 kg)   SpO2 98%   BMI 29.60 kg/m   Wt Readings from Last 3 Encounters:  05/25/20 189 lb (85.7 kg)  04/27/20 199 lb (90.3 kg)  08/03/19 211 lb 12.8 oz (96.1 kg)    Physical Exam Vitals and nursing note reviewed.  Constitutional:      General: She is not in acute distress.    Appearance: She is well-developed. She is not diaphoretic.  Eyes:     Conjunctiva/sclera: Conjunctivae normal.  Cardiovascular:     Rate and Rhythm: Normal rate and regular rhythm.     Heart sounds: Normal heart sounds. No murmur heard.   Pulmonary:     Effort: Pulmonary effort is normal. No respiratory distress.     Breath sounds: Normal breath sounds. No wheezing.  Abdominal:     General: Bowel sounds are normal. There is no distension.     Palpations: Abdomen is soft. Abdomen is not rigid. There is no mass.     Tenderness: There is no abdominal tenderness. There is no right CVA tenderness, left CVA tenderness, guarding or rebound.  Skin:    General: Skin is warm and dry.     Findings: No rash.  Neurological:     Mental Status: She is  alert and oriented to person, place, and time.     Coordination: Coordination normal.  Psychiatric:        Behavior: Behavior normal.     Urinalysis: 1+ leukocyte otherwise negative.  Assessment & Plan:   Problem List Items Addressed This Visit      Other   Obesity (BMI 30.0-34.9)   Relevant Medications   phentermine 37.5 MG capsule    Other Visit Diagnoses    UTI symptoms    -  Primary   Relevant Orders   Urine Culture   Urinalysis      Continue phentermine, will do refill.  Will run culture for UTI. Follow up plan: Return if symptoms worsen or fail to improve.  Counseling provided for all of the vaccine components Orders Placed This Encounter  Procedures  . Urine Culture  . Urinalysis    Caryl Pina, MD [facility] 05/25/2020, 3:42 PM

## 2020-05-26 LAB — URINALYSIS
Bilirubin, UA: NEGATIVE
Glucose, UA: NEGATIVE
Ketones, UA: NEGATIVE
Nitrite, UA: NEGATIVE
Protein,UA: NEGATIVE
RBC, UA: NEGATIVE
Specific Gravity, UA: 1.01 (ref 1.005–1.030)
Urobilinogen, Ur: 0.2 mg/dL (ref 0.2–1.0)
pH, UA: 7 (ref 5.0–7.5)

## 2020-05-28 LAB — URINE CULTURE

## 2020-05-31 ENCOUNTER — Encounter: Payer: Self-pay | Admitting: Family Medicine

## 2020-05-31 NOTE — Telephone Encounter (Signed)
Visit 5/11 with [facility] covering pcp please review culture

## 2020-06-22 ENCOUNTER — Other Ambulatory Visit: Payer: Self-pay

## 2020-06-22 ENCOUNTER — Encounter: Payer: Self-pay | Admitting: Family Medicine

## 2020-06-22 ENCOUNTER — Ambulatory Visit (INDEPENDENT_AMBULATORY_CARE_PROVIDER_SITE_OTHER): Payer: BC Managed Care – PPO | Admitting: Family Medicine

## 2020-06-22 VITALS — BP 142/87 | HR 87 | Ht 67.0 in | Wt 180.0 lb

## 2020-06-22 DIAGNOSIS — E669 Obesity, unspecified: Secondary | ICD-10-CM

## 2020-06-22 MED ORDER — PHENTERMINE HCL 37.5 MG PO CAPS: 37.5000 mg | ORAL_CAPSULE | ORAL | 0 refills | Status: DC

## 2020-06-22 MED ORDER — OZEMPIC (1 MG/DOSE) 2 MG/1.5ML ~~LOC~~ SOPN: 1.0000 mg | PEN_INJECTOR | SUBCUTANEOUS | 3 refills | Status: DC

## 2020-06-22 NOTE — Addendum Note (Signed)
Addended by: Alphonzo Dublin on: 06/22/2020 04:58 PM   Modules accepted: Orders

## 2020-06-22 NOTE — Progress Notes (Signed)
BP (!) 142/87   Pulse 87   Ht 5\' 7"  (1.702 m)   Wt 180 lb (81.6 kg)   SpO2 96%   BMI 28.19 kg/m    Subjective:   Patient ID: Jenna Mendoza, female    DOB: Feb 11, 1990, 30 y.o.   MRN: [id number]  HPI: Jenna Mendoza is a 30 y.o. female presenting on 06/22/2020 for Medical Management of Chronic Issues and Obesity   HPI Obesity and weight loss Patient is taking phentermine for recent weight loss.  She is down another 10 pounds in the last month.  She wants to go ahead and continue to work with the medicine in a month.  She denies any side effects or chest pain or palpitations.  Relevant past medical, surgical, family and social history reviewed and updated as indicated. Interim medical history since our last visit reviewed. Allergies and medications reviewed and updated.  Review of Systems  Constitutional: Negative for chills and fever.  HENT: Negative for congestion, ear discharge and ear pain.   Eyes: Negative for redness and visual disturbance.  Respiratory: Negative for chest tightness and shortness of breath.   Cardiovascular: Negative for chest pain and leg swelling.  Genitourinary: Positive for menstrual problem (having some irregularity with IUD). Negative for difficulty urinating and dysuria.  Musculoskeletal: Negative for back pain and gait problem.  Skin: Negative for rash.  Neurological: Negative for dizziness, light-headedness and headaches.  Psychiatric/Behavioral: Negative for agitation and behavioral problems.  All other systems reviewed and are negative.   Per HPI unless specifically indicated above   Allergies as of 06/22/2020      Reactions   Shellfish Allergy Anaphylaxis, Swelling      Medication List       Accurate as of June 22, 2020  4:32 PM. If you have any questions, ask your nurse or doctor.        acetaminophen 500 MG tablet Commonly known as: TYLENOL Take 1,000 mg by mouth every 6 (six) hours as needed for mild pain or headache.    buPROPion 150 MG 24 hr tablet Commonly known as: WELLBUTRIN XL Take 1 tablet by mouth daily.   Mirena (52 MG) 20 MCG/DAY Iud Generic drug: levonorgestrel 1 Intra Uterine Device by Intrauterine route continuous as needed.   norethindrone-ethinyl estradiol 1-20 MG-MCG tablet Commonly known as: LOESTRIN Take 1 tablet by mouth daily.   Ozempic (1 MG/DOSE) 2 MG/1.5ML Sopn Generic drug: Semaglutide (1 MG/DOSE) Inject 1 mg into the skin once a week. Started by: Fransisca Kaufmann Lene Mckay, MD   phentermine 37.5 MG capsule Take 1 capsule (37.5 mg total) by mouth every morning.        Objective:   BP (!) 142/87   Pulse 87   Ht 5\' 7"  (1.702 m)   Wt 180 lb (81.6 kg)   SpO2 96%   BMI 28.19 kg/m   Wt Readings from Last 3 Encounters:  06/22/20 180 lb (81.6 kg)  05/25/20 189 lb (85.7 kg)  04/27/20 199 lb (90.3 kg)    Physical Exam Vitals and nursing note reviewed.  Constitutional:      General: She is not in acute distress.    Appearance: She is well-developed. She is not diaphoretic.  Eyes:     Conjunctiva/sclera: Conjunctivae normal.  Cardiovascular:     Rate and Rhythm: Normal rate and regular rhythm.     Heart sounds: Normal heart sounds. No murmur heard.   Pulmonary:     Effort: Pulmonary effort is normal.  No respiratory distress.     Breath sounds: Normal breath sounds. No wheezing.  Skin:    General: Skin is warm and dry.     Findings: No rash.  Neurological:     Mental Status: She is alert and oriented to person, place, and time.     Coordination: Coordination normal.  Psychiatric:        Behavior: Behavior normal.       Assessment & Plan:   Problem List Items Addressed This Visit      Other   Obesity (BMI 30.0-34.9) - Primary   Relevant Medications   phentermine 37.5 MG capsule   Semaglutide, 1 MG/DOSE, (OZEMPIC, 1 MG/DOSE,) 2 MG/1.5ML SOPN      Patient is coming in today for obesity and weight recheck doing well with phentermine, will do 1 more month  we will transition onto one of the other weight loss medicines.  bp recheck 130/80, monitor Follow up plan: Return in about 4 weeks (around 07/20/2020), or if symptoms worsen or fail to improve, for Obesity and weight recheck.  Counseling provided for all of the vaccine components No orders of the defined types were placed in this encounter.   Caryl Pina, MD [facility] 06/22/2020, 4:32 PM

## 2020-07-22 ENCOUNTER — Encounter: Payer: Self-pay | Admitting: Family Medicine

## 2020-07-27 ENCOUNTER — Ambulatory Visit: Payer: BC Managed Care – PPO | Admitting: Family Medicine

## 2020-07-30 IMAGING — US US BREAST*L* LIMITED INC AXILLA
1 series · 5 of 5 positions shown · non-contrast
Comparison: Previous exam(s).

CLINICAL DATA: 27-year-old female with focal left breast palpable
lump/pain for several months.

EXAM:
ULTRASOUND OF THE LEFT BREAST

[Series 1: us breast*left* limited inc axilla · 0.07mm/px · 5 of 5 slices shown]
[im 1/5]
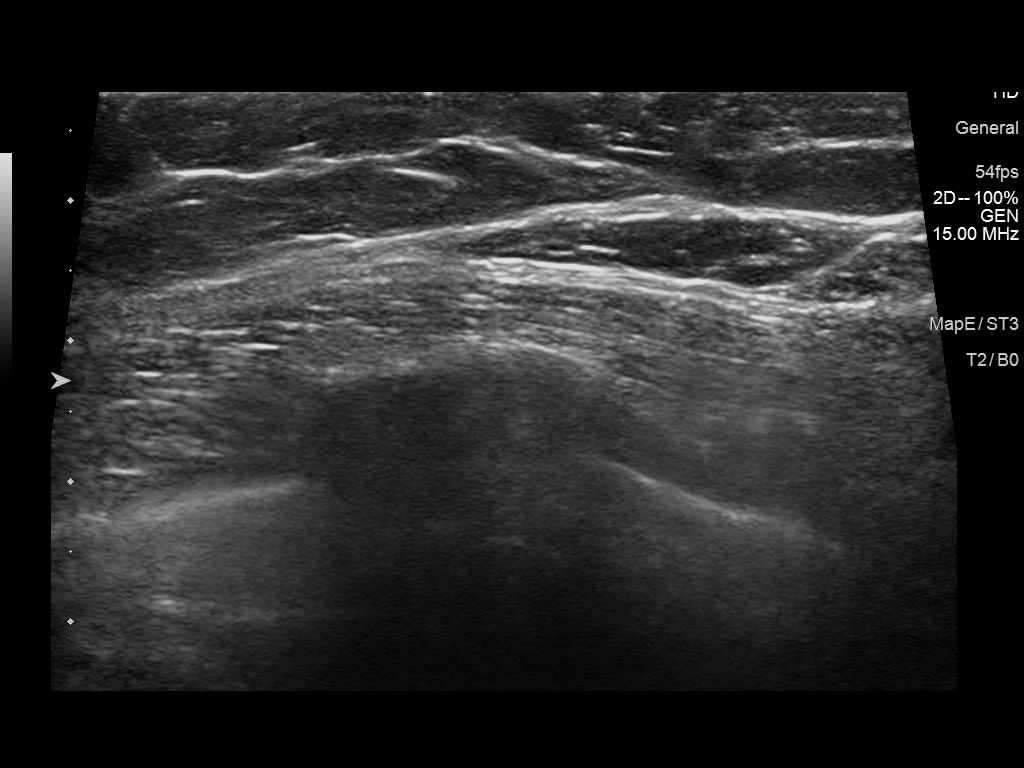
[im 2/5]
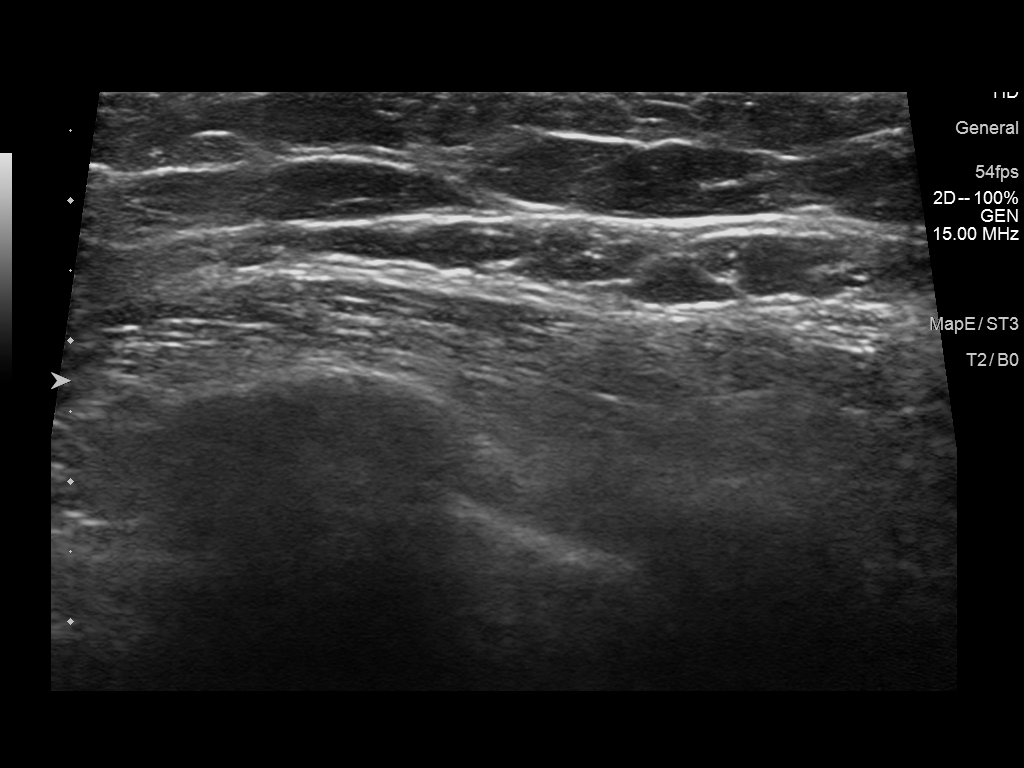
[im 3/5]
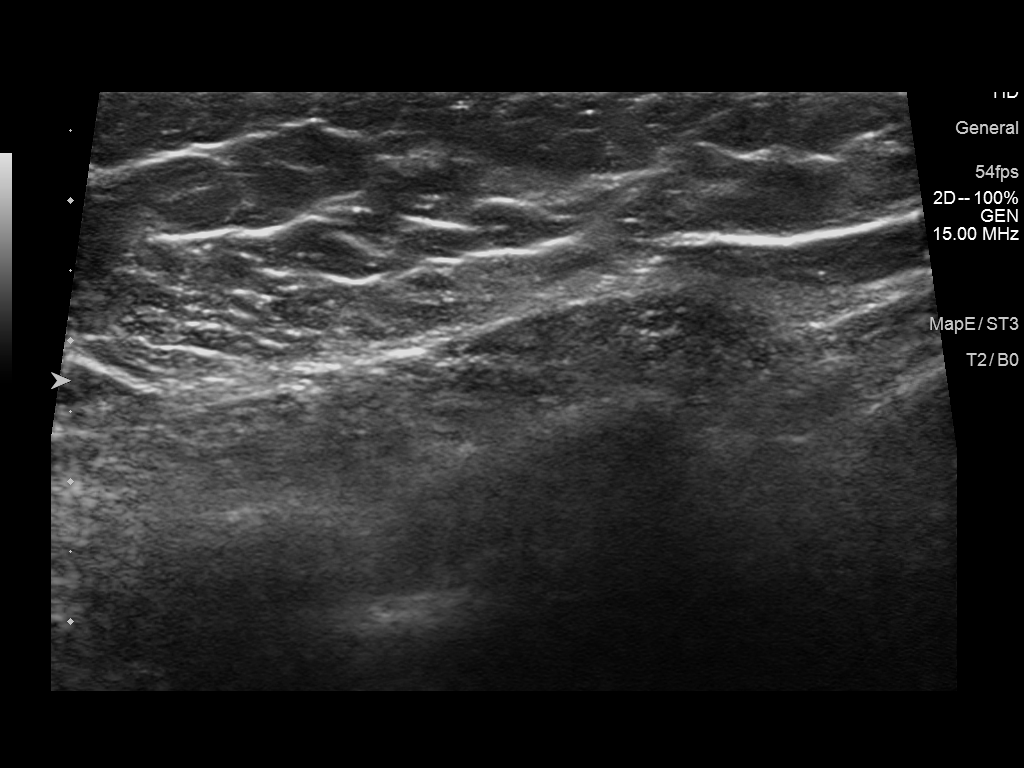
[im 4/5]
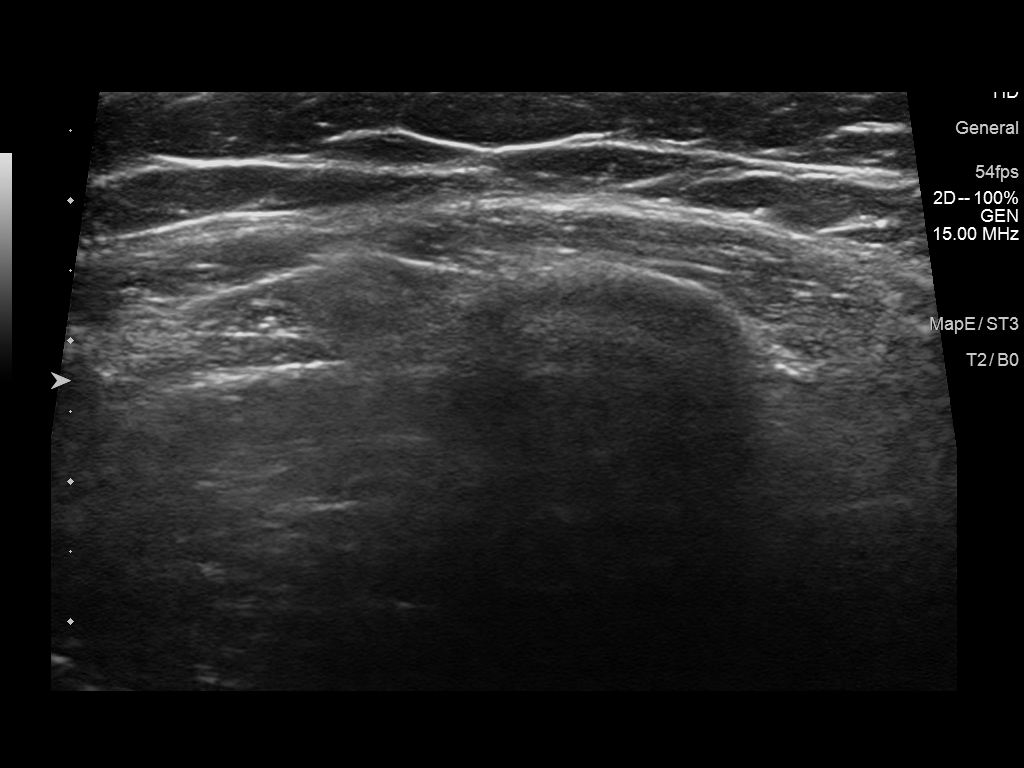
[im 5/5]
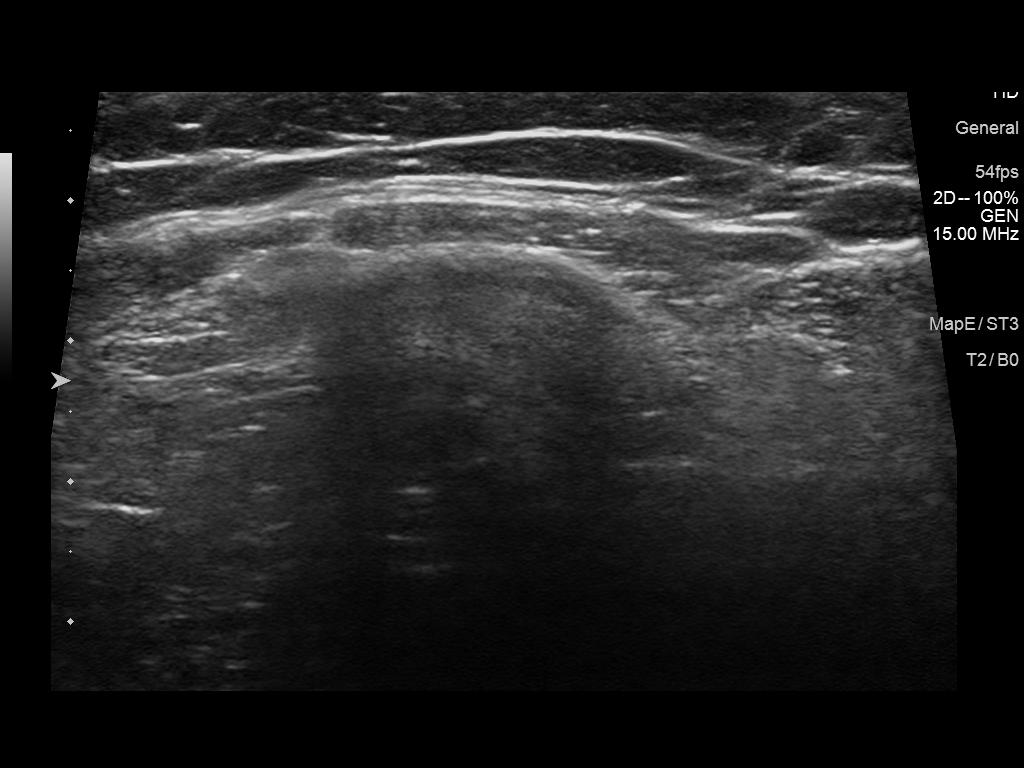

[5 of 5 positions shown; findings below may reference images not displayed]

FINDINGS: On physical exam, I palpate no suspicious lumps in the lateral left
breast.

Targeted ultrasound is performed, showing normal fibroglandular in
soft tissue without focal or suspicious sonographic abnormality.
Evaluation of the lower outer quadrant was performed at the site of
the patient's clinical symptoms.
IMPRESSION: No suspicious sonographic findings corresponding with the patient's
lateral left breast lump/pain.

RECOMMENDATION:
1. Clinical follow-up recommended for the symptomatic area of
concern in the left breast. Any further workup should be based on
clinical grounds.
2. Screening mammogram at age 41 unless there are persistent or
intervening clinical concerns. (Code:69-V-M2E)

I have discussed the findings and recommendations with the patient.
Results were also provided in writing at the conclusion of the
visit. If applicable, a reminder letter will be sent to the patient
regarding the next appointment.

BI-RADS CATEGORY  1: Negative.

## 2020-08-22 ENCOUNTER — Ambulatory Visit (INDEPENDENT_AMBULATORY_CARE_PROVIDER_SITE_OTHER): Payer: BC Managed Care – PPO | Admitting: Family Medicine

## 2020-08-22 ENCOUNTER — Encounter: Payer: Self-pay | Admitting: Family Medicine

## 2020-08-22 ENCOUNTER — Other Ambulatory Visit: Payer: Self-pay

## 2020-08-22 VITALS — BP 108/76 | HR 80 | Ht 67.0 in | Wt 161.4 lb

## 2020-08-22 DIAGNOSIS — F419 Anxiety disorder, unspecified: Secondary | ICD-10-CM

## 2020-08-22 DIAGNOSIS — F32A Depression, unspecified: Secondary | ICD-10-CM

## 2020-08-22 DIAGNOSIS — E663 Overweight: Secondary | ICD-10-CM | POA: Diagnosis not present

## 2020-08-22 MED ORDER — BUPROPION HCL ER (XL) 300 MG PO TB24: 300.0000 mg | ORAL_TABLET | Freq: Every day | ORAL | 1 refills | Status: DC

## 2020-08-22 NOTE — Progress Notes (Signed)
BP 108/76   Pulse 80   Ht '5\' 7"'$  (1.702 m)   Wt 161 lb 6 oz (73.2 kg)   SpO2 99%   BMI 25.27 kg/m    Subjective:   Patient ID: Jenna Mendoza, female    DOB: [birth year], 30 y.o.   MRN: [id number]  HPI: Jenna Mendoza is a 30 y.o. female presenting on 08/22/2020 for Obesity (29 week weight check)   HPI Obesity and weight recheck Patient has lost a lot of weight again and has done well on the phentermine.  This is her third month.  She started on Ozempic this past month as well and feels like it has decreased her appetite.  She denies any nausea or diarrhea but it is definitely decreased her appetite  Anxiety and depression recheck Patient is coming in to discuss anxiety and depression.  She says she is feeling a lot more irritable and she gets angered and upset a lot easier and her family and those around her and she feels like it is just building up to where the Wellbutrin is not working as well as it used to.  Patient denies any suicidal ideation.  She denies feeling as much down.  She does admit the part of her frustrations is when she is feeling a lot better physically but her husband is not and she has a lot of sexual libido and he does not and that has become a contention issue as well that is built up.  She would like to increase her medicine to see if it would help a little bit  Relevant past medical, surgical, family and social history reviewed and updated as indicated. Interim medical history since our last visit reviewed. Allergies and medications reviewed and updated.  Review of Systems  Constitutional:  Negative for chills and fever.  Eyes:  Negative for visual disturbance.  Respiratory:  Negative for chest tightness and shortness of breath.   Cardiovascular:  Negative for chest pain and leg swelling.  Gastrointestinal:  Negative for diarrhea, nausea and vomiting.  Musculoskeletal:  Negative for back pain and gait problem.  Skin:  Negative for rash.  Neurological:   Negative for light-headedness and headaches.  Psychiatric/Behavioral:  Positive for dysphoric mood. Negative for agitation, behavioral problems, decreased concentration, self-injury, sleep disturbance and suicidal ideas. The patient is nervous/anxious.   All other systems reviewed and are negative.  Per HPI unless specifically indicated above   Allergies as of 08/22/2020       Reactions   Shellfish Allergy Anaphylaxis, Swelling        Medication List        Accurate as of August 22, 2020  1:06 PM. If you have any questions, ask your nurse or doctor.          STOP taking these medications    phentermine 37.5 MG capsule Stopped by: Fransisca Kaufmann Loris Winrow, MD       TAKE these medications    acetaminophen 500 MG tablet Commonly known as: TYLENOL Take 1,000 mg by mouth every 6 (six) hours as needed for mild pain or headache.   buPROPion 300 MG 24 hr tablet Commonly known as: WELLBUTRIN XL Take 1 tablet (300 mg total) by mouth daily. What changed:  medication strength how much to take Changed by: Fransisca Kaufmann Dinora Hemm, MD   Mirena (52 MG) 20 MCG/DAY Iud Generic drug: levonorgestrel 1 Intra Uterine Device by Intrauterine route continuous as needed.   norethindrone-ethinyl estradiol 1-20 MG-MCG tablet Commonly known  as: LOESTRIN Take 1 tablet by mouth daily.   Ozempic (1 MG/DOSE) 2 MG/1.5ML Sopn Generic drug: Semaglutide (1 MG/DOSE) Inject 1 mg into the skin once a week.         Objective:   BP 108/76   Pulse 80   Ht '5\' 7"'$  (1.702 m)   Wt 161 lb 6 oz (73.2 kg)   SpO2 99%   BMI 25.27 kg/m   Wt Readings from Last 3 Encounters:  08/22/20 161 lb 6 oz (73.2 kg)  06/22/20 180 lb (81.6 kg)  05/25/20 189 lb (85.7 kg)    Physical Exam Vitals and nursing note reviewed.  Constitutional:      General: She is not in acute distress.    Appearance: She is well-developed. She is not diaphoretic.  Eyes:     Conjunctiva/sclera: Conjunctivae normal.  Cardiovascular:      Rate and Rhythm: Normal rate and regular rhythm.     Heart sounds: Normal heart sounds. No murmur heard. Pulmonary:     Effort: Pulmonary effort is normal. No respiratory distress.     Breath sounds: Normal breath sounds. No wheezing.  Skin:    General: Skin is warm and dry.     Findings: No rash.  Neurological:     Mental Status: She is alert and oriented to person, place, and time.     Coordination: Coordination normal.  Psychiatric:        Behavior: Behavior normal.      Assessment & Plan:   Problem List Items Addressed This Visit   None Visit Diagnoses     Overweight    -  Primary   Anxiety and depression       Relevant Medications   buPROPion (WELLBUTRIN XL) 29 MG 24 hr tablet       Increase Wellbutrin to 300 mg, continue Ozempic, patient feels like she is doing well.  No other changes. Follow up plan: Return if symptoms worsen or fail to improve, for 1-2 month recheck weight and depression.  Counseling provided for all of the vaccine components No orders of the defined types were placed in this encounter.   Caryl Pina, MD [facility] 08/22/2020, 1:06 PM

## 2020-09-30 ENCOUNTER — Ambulatory Visit: Payer: BC Managed Care – PPO | Admitting: Family Medicine

## 2020-10-04 ENCOUNTER — Encounter: Payer: Self-pay | Admitting: Family Medicine

## 2020-10-26 ENCOUNTER — Ambulatory Visit (INDEPENDENT_AMBULATORY_CARE_PROVIDER_SITE_OTHER): Payer: BC Managed Care – PPO | Admitting: Family Medicine

## 2020-10-26 ENCOUNTER — Other Ambulatory Visit: Payer: Self-pay

## 2020-10-26 ENCOUNTER — Encounter: Payer: Self-pay | Admitting: Family Medicine

## 2020-10-26 VITALS — BP 125/79 | HR 79 | Ht 67.0 in | Wt 148.0 lb

## 2020-10-26 DIAGNOSIS — E663 Overweight: Secondary | ICD-10-CM

## 2020-10-26 MED ORDER — OZEMPIC (0.25 OR 0.5 MG/DOSE) 2 MG/1.5ML ~~LOC~~ SOPN: 0.5000 mg | PEN_INJECTOR | SUBCUTANEOUS | 3 refills | Status: DC

## 2020-10-26 NOTE — Progress Notes (Signed)
BP 125/79   Pulse 79   Ht 5\' 7"  (1.702 m)   Wt 148 lb (67.1 kg)   SpO2 100%   BMI 23.18 kg/m    Subjective:   Patient ID: Jenna Mendoza, female    DOB: 10/31/1990, 30 y.o.   MRN: [id number]  HPI: Jenna Mendoza is a 30 y.o. female presenting on 10/26/2020 for Medical Management of Chronic Issues and Obesity (Wt check)   HPI Obesity and weight Patient is coming in for obesity and weight management.  She is down to a BMI of 23 and doing very well and the Ozempic is doing good for her.  She started backing off on the Ozempic to every other week and still doing diet and exercise and she wants to lower the dose down and keep it for a little bit longer and then back off of it completely.  She feels very healthy and feels very good and is very happy with where she is at.  Relevant past medical, surgical, family and social history reviewed and updated as indicated. Interim medical history since our last visit reviewed. Allergies and medications reviewed and updated.  Review of Systems  Constitutional:  Negative for chills and fever.  Eyes:  Negative for visual disturbance.  Respiratory:  Negative for chest tightness and shortness of breath.   Cardiovascular:  Negative for chest pain and leg swelling.  Musculoskeletal:  Negative for back pain and gait problem.  Skin:  Negative for rash.  Neurological:  Negative for light-headedness and headaches.  Psychiatric/Behavioral:  Negative for agitation and behavioral problems.   All other systems reviewed and are negative.  Per HPI unless specifically indicated above   Allergies as of 10/26/2020       Reactions   Shellfish Allergy Anaphylaxis, Swelling        Medication List        Accurate as of October 26, 2020  2:18 PM. If you have any questions, ask your nurse or doctor.          STOP taking these medications    norethindrone-ethinyl estradiol 1-20 MG-MCG tablet Commonly known as: LOESTRIN Stopped by: Fransisca Kaufmann  Rally Ouch, MD   Ozempic (1 MG/DOSE) 2 MG/1.5ML Sopn Generic drug: Semaglutide (1 MG/DOSE) Replaced by: Ozempic (0.25 or 0.5 MG/DOSE) 2 MG/1.5ML Sopn Stopped by: Fransisca Kaufmann Gorden Stthomas, MD       TAKE these medications    acetaminophen 500 MG tablet Commonly known as: TYLENOL Take 1,000 mg by mouth every 6 (six) hours as needed for mild pain or headache.   buPROPion 300 MG 24 hr tablet Commonly known as: WELLBUTRIN XL Take 1 tablet (300 mg total) by mouth daily.   Mirena (52 MG) 20 MCG/DAY Iud Generic drug: levonorgestrel 1 Intra Uterine Device by Intrauterine route continuous as needed.   Ozempic (0.25 or 0.5 MG/DOSE) 2 MG/1.5ML Sopn Generic drug: Semaglutide(0.25 or 0.5MG /DOS) Inject 0.5 mg into the skin once a week. Replaces: Ozempic (1 MG/DOSE) 2 MG/1.5ML Sopn Started by: Fransisca Kaufmann [employer], MD         Objective:   BP 125/79   Pulse 79   Ht 5\' 7"  (1.702 m)   Wt 148 lb (67.1 kg)   SpO2 100%   BMI 23.18 kg/m   Wt Readings from Last 3 Encounters:  10/26/20 148 lb (67.1 kg)  08/22/20 161 lb 6 oz (73.2 kg)  06/22/20 180 lb (81.6 kg)    Physical Exam Vitals and nursing note reviewed.  Constitutional:  General: She is not in acute distress.    Appearance: She is well-developed. She is not diaphoretic.  Eyes:     Conjunctiva/sclera: Conjunctivae normal.  Cardiovascular:     Rate and Rhythm: Normal rate and regular rhythm.     Heart sounds: Normal heart sounds. No murmur heard. Pulmonary:     Effort: Pulmonary effort is normal. No respiratory distress.     Breath sounds: Normal breath sounds. No wheezing.  Musculoskeletal:        General: No tenderness. Normal range of motion.  Skin:    General: Skin is warm and dry.     Findings: No rash.  Neurological:     Mental Status: She is alert and oriented to person, place, and time.     Coordination: Coordination normal.  Psychiatric:        Behavior: Behavior normal.      Assessment & Plan:   Problem  List Items Addressed This Visit   None Visit Diagnoses     Overweight    -  Primary   Relevant Medications   Semaglutide,0.25 or 0.5MG /DOS, (OZEMPIC, 0.25 OR 0.5 MG/DOSE,) 2 MG/1.5ML SOPN       Patient coming in for weight recheck, her BMI now is down to 23 and to a normal weight. Follow up plan: Return in about 6 months (around 04/26/2021), or if symptoms worsen or fail to improve, for Anxiety depression recheck.  Counseling provided for all of the vaccine components No orders of the defined types were placed in this encounter.   Caryl Pina, MD [facility] 10/26/2020, 2:18 PM

## 2020-10-27 ENCOUNTER — Encounter (HOSPITAL_COMMUNITY): Payer: Self-pay

## 2020-10-27 ENCOUNTER — Ambulatory Visit: Payer: BC Managed Care – PPO | Admitting: Family Medicine

## 2020-10-27 ENCOUNTER — Other Ambulatory Visit: Payer: Self-pay

## 2020-10-27 ENCOUNTER — Emergency Department (HOSPITAL_COMMUNITY): Payer: BC Managed Care – PPO

## 2020-10-27 ENCOUNTER — Emergency Department (HOSPITAL_COMMUNITY)
Admission: EM | Admit: 2020-10-27 | Discharge: 2020-10-27 | Disposition: A | Discharge status: Home / Self Care | Payer: BC Managed Care – PPO | Attending: Emergency Medicine | Admitting: Emergency Medicine

## 2020-10-27 DIAGNOSIS — Z794 Long term (current) use of insulin: Secondary | ICD-10-CM | POA: Insufficient documentation

## 2020-10-27 DIAGNOSIS — R1013 Epigastric pain: Secondary | ICD-10-CM | POA: Diagnosis present

## 2020-10-27 DIAGNOSIS — E876 Hypokalemia: Secondary | ICD-10-CM | POA: Diagnosis not present

## 2020-10-27 DIAGNOSIS — K8020 Calculus of gallbladder without cholecystitis without obstruction: Secondary | ICD-10-CM | POA: Insufficient documentation

## 2020-10-27 LAB — CBC
HCT: 42.3 % (ref 36.0–46.0)
Hemoglobin: 13.7 g/dL (ref 12.0–15.0)
MCH: 29.5 pg (ref 26.0–34.0)
MCHC: 32.4 g/dL (ref 30.0–36.0)
MCV: 91 fL (ref 80.0–100.0)
Platelets: 201 10*3/uL (ref 150–400)
RBC: 4.65 MIL/uL (ref 3.87–5.11)
RDW: 12.4 % (ref 11.5–15.5)
WBC: 6.3 10*3/uL (ref 4.0–10.5)
nRBC: 0 % (ref 0.0–0.2)

## 2020-10-27 LAB — COMPREHENSIVE METABOLIC PANEL
ALT: 15 U/L (ref 0–44)
AST: 23 U/L (ref 15–41)
Albumin: 4.2 g/dL (ref 3.5–5.0)
Alkaline Phosphatase: 58 U/L (ref 38–126)
Anion gap: 7 (ref 5–15)
BUN: 10 mg/dL (ref 6–20)
CO2: 25 mmol/L (ref 22–32)
Calcium: 8.7 mg/dL — ABNORMAL LOW (ref 8.9–10.3)
Chloride: 107 mmol/L (ref 98–111)
Creatinine, Ser: 0.59 mg/dL (ref 0.44–1.00)
GFR, Estimated: >60 mL/min (ref >60)
Glucose, Bld: 76 mg/dL (ref 70–99)
Potassium: 3.4 mmol/L — ABNORMAL LOW (ref 3.5–5.1)
Sodium: 139 mmol/L (ref 135–145)
Total Bilirubin: 0.6 mg/dL (ref 0.3–1.2)
Total Protein: 7.1 g/dL (ref 6.5–8.1)

## 2020-10-27 LAB — URINALYSIS, ROUTINE W REFLEX MICROSCOPIC
Bilirubin Urine: NEGATIVE
Glucose, UA: NEGATIVE mg/dL
Ketones, ur: NEGATIVE mg/dL
Nitrite: NEGATIVE
Protein, ur: NEGATIVE mg/dL
Specific Gravity, Urine: 1.004 — ABNORMAL LOW (ref 1.005–1.030)
pH: 8 (ref 5.0–8.0)

## 2020-10-27 LAB — LIPASE, BLOOD: Lipase: 33 U/L (ref 11–51)

## 2020-10-27 NOTE — Discharge Instructions (Addendum)
Lab work is all reassuring, imaging shows that you have some sludge in her gallbladder.  Recommend over-the-counter pain medications as needed  Please follow-up with general surgery for further evaluation.  Come back to the emergency department if you develop chest pain, shortness of breath, severe abdominal pain, uncontrolled nausea, vomiting, diarrhea.

## 2020-10-27 NOTE — ED Provider Notes (Signed)
[facility] Provider Note   CSN: [id number] Arrival date & time: 10/27/20  1009     History No chief complaint on file.   Jenna Mendoza is a 30 y.o. female.  HPI  Patient with no significant medical history presents to the emergency department with chief complaint of epigastric pain.  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  she attempted to vomit but was unsuccessful and did not improve with her pain.  Patient's pain lasted approximately 10 to 15 minutes and then slowly resolve on its own she currently has no pain at this time.  She states that she has had an episode of this in the past about a week ago which also  resolved on its own.  She has had no significant abdominal history, she denies NSAID use, alcohol use, history of PUD, kidney stones, gallbladder stones she denies urinary symptoms at this time.  Patient does not endorse fevers, chills, chest pain, shortness of breath, general body aches.  She has no other complaints at this time.  Past Medical History:  Diagnosis Date   Allergy    shellfish   Degenerative joint disease 2008   Postpartum care following vaginal delivery (11/6) 11/21/2015   Synovial chondromatoses    Urinary tract infection during pregnancy mid december 2012    Patient Active Problem List   Diagnosis Date Noted   Non-reassuring fetal heart rate with late deceleration 08/03/2019    Past Surgical History:  Procedure Laterality Date   SHOULDER SURGERY Right      OB History     Gravida  5   Para  4   Term  4   Preterm      AB  1   Living  4      SAB  1   IAB      Ectopic      Multiple  0   Live Births  4           Family History  Problem Relation Age of Onset   Diabetes Mellitus II Paternal Grandfather     Hypertension Father    Breast cancer Maternal Grandmother 31   Schizophrenia Mother    Bipolar disorder Mother    Breast cancer Mother 42   Epilepsy Brother    Anesthesia problems Neg Hx    Hypotension Neg Hx    Malignant hyperthermia Neg Hx    Pseudochol deficiency Neg Hx     Social History   Tobacco Use   Smoking status: Never   Smokeless tobacco: Never  Vaping Use   Vaping Use: Never used  Substance Use Topics   Alcohol use: Not Currently    Alcohol/week: 1.0 standard drink    Types: 1 Standard drinks or equivalent per week    Comment: not while preg   Drug use: No    Home Medications Prior to Admission medications   Medication Sig Start Date End Date Taking? Authorizing Provider  acetaminophen (TYLENOL) 500 MG tablet Take 1,000 mg by mouth every 6 (six) hours as needed for mild pain or headache.    [provider]  buPROPion (WELLBUTRIN XL) 300 MG 24 hr tablet Take 1 tablet (300 mg total) by mouth daily. 08/22/20   Dettinger, Fransisca Kaufmann, MD  levonorgestrel (MIRENA, 52 MG,) 20 MCG/24HR IUD 1 Intra Uterine  Device by Intrauterine route continuous as needed.    [provider]  Semaglutide,0.25 or 0.5MG/DOS, (OZEMPIC, 0.25 OR 0.5 MG/DOSE,) 2 MG/1.5ML SOPN Inject 0.5 mg into the skin once a week. 10/26/20   Dettinger, Fransisca Kaufmann, MD    Allergies    Shellfish allergy  Review of Systems   Review of Systems  Constitutional:  Negative for chills and fever.  HENT:  Negative for congestion.   Respiratory:  Negative for shortness of breath.   Cardiovascular:  Negative for chest pain.  Gastrointestinal:  Negative for abdominal pain.  Genitourinary:  Negative for enuresis.  Musculoskeletal:  Negative for back pain.  Skin:  Negative for rash.  Neurological:  Negative for dizziness.  Hematological:  Does not bruise/bleed easily.   Physical Exam Updated Vital Signs BP 131/76 (BP Location: Right Arm)   Pulse 65   Temp (!) 97.4 F (36.3 C) (Oral)   Resp 16   Ht  _0  (1.676 m)   Wt 64.9 kg   LMP 10/20/2020   SpO2 100%   BMI 23.08 kg/m   Physical Exam Vitals and nursing note reviewed.  Constitutional:      General: She is not in acute distress.    Appearance: She is not ill-appearing.  HENT:     Head: Normocephalic and atraumatic.     Nose: No congestion.  Eyes:     Conjunctiva/sclera: Conjunctivae normal.  Cardiovascular:     Rate and Rhythm: Normal rate and regular rhythm.     Pulses: Normal pulses.     Heart sounds: No murmur heard.   No friction rub. No gallop.  Pulmonary:     Effort: No respiratory distress.     Breath sounds: No wheezing, rhonchi or rales.  Abdominal:     Palpations: Abdomen is soft.     Tenderness: There is no abdominal tenderness. There is no right CVA tenderness or left CVA tenderness.     Comments: Abdomen nondistended, normal bowel sounds, dull to percussion, nontender to palpation, no guarding, rebound tenderness, peritoneal sign.  No CVA tenderness.  Musculoskeletal:        General: Normal range of motion.     Right lower leg: No edema.     Left lower leg: No edema.     Comments: Spine was palpated was nontender to palpation.  Skin:    General: Skin is warm and dry.  Neurological:     Mental Status: She is alert.  Psychiatric:        Mood and Affect: Mood normal.    ED Results / Procedures / Treatments   Labs (all labs ordered are listed, but only abnormal results are displayed) Labs Reviewed  COMPREHENSIVE METABOLIC PANEL - Abnormal; Notable for the following components:      Result Value   Potassium 3.4 (*)    Calcium 8.7 (*)    All other components within normal limits  URINALYSIS, ROUTINE W REFLEX MICROSCOPIC - Abnormal; Notable for the following components:   Color, Urine STRAW (*)    Specific Gravity, Urine 1.004 (*)    Hgb urine dipstick SMALL (*)    Leukocytes,Ua SMALL (*)    Bacteria, UA RARE (*)    All other components within normal limits  LIPASE, BLOOD  CBC  POC URINE PREG,  ED    EKG None  Radiology US Abdomen Limited  Result Date: 10/27/2020 CLINICAL DATA:  Right upper quadrant pain EXAM: ULTRASOUND ABDOMEN LIMITED RIGHT UPPER QUADRANT COMPARISON:  None. FINDINGS: Gallbladder:  There is layering sludge and small stones within gallbladder lumen. The gallbladder wall is not thickened, allowing for under distension. There is no pericholecystic fluid. Sonographic Percell Miller sign was reported negative. Common bile duct: Diameter: 3 mm Liver: No focal lesion identified. Within normal limits in parenchymal echogenicity. Portal vein is patent on color Doppler imaging with normal direction of blood flow towards the liver. Other: None. IMPRESSION: Cholelithiasis and gallbladder sludge without evidence of acute cholecystitis. Electronically Signed   By: Valetta Mole M.D.   On: 10/27/2020 13:04    Procedures Procedures   Medications Ordered in ED Medications - No data to display  ED Course  I have reviewed the triage vital signs and the nursing notes.  Pertinent labs & imaging results that were available during my care of the patient were reviewed by me and considered in my medical decision making (see chart for details).    MDM Rules/Calculators/A&P                          Initial impression-presents with stomach pain that has since resolved, she is alert, does not appear acute stress, vital signs reassuring.  Etiology unclear, concern for possible gallbladder attack, triage obtain basic lab work-up, will obtain limited ultrasound and reassess.  Work-up-CBC unremarkable, CMP shows hypokalemia 3.4, calcium 8.7, lipase 33.  UA shows small leukocytes, 11-20 white blood cells, rare bacteria.  Lab and ultrasound reveals cholelithiasis and gallbladder sludge without evidence of acute cholecystitis.  Reassessment-patient is updated on lab work and imaging, she has had no abdominal pain since her initial presentation, she states she feels fine and is ready for  discharge.  Rule out-low suspicion for systemic infection as patient nontoxic-appearing, vital signs are reassuring, no leukocytosis on CBC.  I have low suspicion for liver or gallbladder abnormality as she has no right upper quadrant tenderness, she has no elevation in liver enzymes, alk phos, T bili, liver ultrasound is also negative for acute findings.  I have low suspicion for appendicitis as she has no right lower quadrant tenderness, she has no leukocytosis, presentation atypical etiology.  Low suspicion for complicated diverticulitis as she has no white count, she has no history of this, vital signs are reassuring.  Low  suspicion for UTI, Pilo, kidney stone she denies any urinary symptoms, UA negative for sign infection or hematuria.  Plan-  Epigastric pain since resolved-unclear etiology, possibly could have had a gallbladder attack, will recommend that she follows up with general surgery for further evaluation.  Gave her strict return precautions.  Vital signs have remained stable, no indication for hospital admission.    Patient given at home care as well strict return precautions.  Patient verbalized that they understood agreed to said plan.  Final Clinical Impression(s) / ED Diagnoses Final diagnoses:  Epigastric pain    Rx / DC Orders ED Discharge Orders     None        Marcello Fennel, PA-C 10/27/20 1400    Isla Pence, MD 11/01/20 1504

## 2020-10-27 NOTE — ED Triage Notes (Signed)
Pt presents to ED via [facility]. 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 Pain has eased at this time.

## 2020-11-03 ENCOUNTER — Other Ambulatory Visit: Payer: Self-pay

## 2020-11-03 ENCOUNTER — Encounter: Payer: Self-pay | Admitting: General Surgery

## 2020-11-03 ENCOUNTER — Ambulatory Visit (INDEPENDENT_AMBULATORY_CARE_PROVIDER_SITE_OTHER): Payer: BC Managed Care – PPO | Admitting: General Surgery

## 2020-11-03 VITALS — BP 110/75 | HR 69 | Temp 98.2°F | Resp 12 | Ht 66.0 in | Wt 149.0 lb

## 2020-11-03 DIAGNOSIS — K8020 Calculus of gallbladder without cholecystitis without obstruction: Secondary | ICD-10-CM

## 2020-11-03 NOTE — Progress Notes (Signed)
[facility] History and Physical  Reason for Referral: Gallbladder  Referring Physician:  Dettinger, Fransisca Kaufmann, MD   Chief Complaint   New Patient (Initial Visit)     Jenna Mendoza is a 30 y.o. female.  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 She has been having pain in her right side and right back and associated nausea. She had an episode where she had to be brought to the hospital by EMS due to a panic attack that came on with the pain. Her abdominal pain hit her all of a sudden and she had a severe anxiety attack and could not move. She had resolution of her abdominal pain by the time she made it to the hospital but was worked up for her anxiety attack. She had eaten a bacon egg biscuit that morning. Since that time she has avoided greasy or fatty foods. Overall she is feeling ok but is nervous about repeat episodes.   Past Medical History:  Diagnosis Date   Allergy    shellfish   Degenerative joint disease 2008   Postpartum care following vaginal delivery (11/6) 11/21/2015   Synovial chondromatoses    Urinary tract infection during pregnancy mid december 2012    Past Surgical History:  Procedure Laterality Date   SHOULDER SURGERY Right     Family History  Problem Relation Age of Onset   Diabetes Mellitus II Paternal Grandfather    Hypertension Father    Breast cancer Maternal Grandmother 75   Schizophrenia Mother    Bipolar disorder Mother    Breast cancer Mother 59   Epilepsy Brother    Anesthesia problems Neg Hx    Hypotension Neg Hx    Malignant hyperthermia Neg Hx    Pseudochol deficiency Neg Hx     Social History   Tobacco Use   Smoking status: Never   Smokeless tobacco: Never  Vaping Use   Vaping Use: Never used  Substance Use Topics   Alcohol use: Not Currently    Alcohol/week: 1.0 standard drink    Types: 1 Standard drinks or equivalent per week    Comment: not while preg   Drug use: No    Medications: I  have reviewed the patient's current medications. Allergies as of 11/03/2020       Reactions   Shellfish Allergy Anaphylaxis, Swelling        Medication List        Accurate as of November 03, 2020 11:12 AM. If you have any questions, ask your nurse or doctor.          acetaminophen 500 MG tablet Commonly known as: TYLENOL Take 1,000 mg by mouth every 6 (six) hours as needed for mild pain or headache.   buPROPion 300 MG 24 hr tablet Commonly known as: WELLBUTRIN XL Take 1 tablet (300 mg total) by mouth daily.   Mirena (52 MG) 20 MCG/DAY Iud Generic drug: levonorgestrel 1 Intra Uterine Device by Intrauterine route continuous as needed.   Ozempic (0.25 or 0.5 MG/DOSE) 2 MG/1.5ML Sopn Generic drug: Semaglutide(0.25 or 0.5MG /DOS) Inject 0.5 mg into the skin once a week.         ROS:  A comprehensive review of systems was negative except for: Gastrointestinal: positive for abdominal pain, nausea, and reflux symptoms Musculoskeletal: positive for back pain  Blood pressure 110/75, pulse 69, temperature 98.2 F (36.8 C), temperature source Other (Comment), resp. rate 12, height 5\' 6"  (1.676 m), weight 149 lb (  67.6 kg), last menstrual period 10/20/2020, SpO2 97 %, unknown if currently breastfeeding. Physical Exam Vitals reviewed.  Constitutional:      Appearance: Normal appearance.  HENT:     Head: Normocephalic.     Nose: Nose normal.     Mouth/Throat:     Mouth: Mucous membranes are moist.  Eyes:     Extraocular Movements: Extraocular movements intact.  Cardiovascular:     Rate and Rhythm: Normal rate and regular rhythm.  Pulmonary:     Effort: Pulmonary effort is normal.     Breath sounds: Normal breath sounds.  Abdominal:     General: There is no distension.     Palpations: Abdomen is soft.     Tenderness: There is abdominal tenderness in the right upper quadrant.  Musculoskeletal:        General: Normal range of motion.     Cervical back: Normal range of  motion.  Skin:    General: Skin is warm.  Neurological:     General: No focal deficit present.     Mental Status: She is alert and oriented to person, place, and time.  Psychiatric:        Mood and Affect: Mood normal.        Behavior: Behavior normal.        Thought Content: Thought content normal.    Results: CLINICAL DATA:  Right upper quadrant pain   EXAM: ULTRASOUND ABDOMEN LIMITED RIGHT UPPER QUADRANT   COMPARISON:  None.   FINDINGS: Gallbladder:   There is layering sludge and small stones within gallbladder lumen. The gallbladder wall is not thickened, allowing for under distension. There is no pericholecystic fluid. Sonographic Percell Miller sign was reported negative.   Common bile duct:   Diameter: 3 mm   Liver:   No focal lesion identified. Within normal limits in parenchymal echogenicity. Portal vein is patent on color Doppler imaging with normal direction of blood flow towards the liver.   Other: None.   IMPRESSION: Cholelithiasis and gallbladder sludge without evidence of acute cholecystitis.     Electronically Signed   By: Valetta Mole M.D.   On: 10/27/2020 13:04  Assessment & Plan:  Jenna Mendoza is a 30 y.o. female with gallstones that have caused her some significant pain during her episodes.  PLAN: I counseled the patient about the indication, risks and benefits of laparoscopic cholecystectomy versus [place] assisted cholecystectomy if the equipment is available for her surgery date.  She understands there is a very small chance for bleeding, infection, injury to normal structures (including common bile duct), conversion to open surgery, persistent symptoms, evolution of postcholecystectomy diarrhea, need for secondary interventions, anesthesia reaction, cardiopulmonary issues and other risks not specifically detailed here. I described the expected recovery, the plan for follow-up and the restrictions during the recovery phase.  All  questions were answered.   All questions were answered to the satisfaction of the patient.    Virl Cagey 11/03/2020, 11:12 AM

## 2020-11-03 NOTE — Patient Instructions (Addendum)
Minimally Invasive Laparoscopic/ Robotic Cholecystectomy Minimally invasive cholecystectomy is surgery to remove the gallbladder. The gallbladder is a pear-shaped organ that lies beneath the liver on the right side of the body. The gallbladder stores bile, which is a fluid that helps the body digest fats. Cholecystectomy is often done to treat inflammation of the gallbladder (cholecystitis). This condition is usually caused by a buildup of gallstones (cholelithiasis) in the gallbladder. Gallstones can block the flow of bile, which can result in inflammation and pain. In severe cases, emergency surgery may be required. This procedure is done though small incisions in the abdomen, instead of one large incision. It is also called laparoscopic surgery. A thin scope with a camera (laparoscope) is inserted through one incision. Then surgical instruments are inserted through the other incisions. In some cases, a minimally invasive surgery may need to be changed to a surgery that is done through a larger incision. This is called open surgery. Tell a health care provider about: Any allergies you have. All medicines you are taking, including vitamins, herbs, eye drops, creams, and over-the-counter medicines. Any problems you or family members have had with anesthetic medicines. Any blood disorders you have. Any surgeries you have had. Any medical conditions you have. Whether you are pregnant or may be pregnant. What are the risks? Generally, this is a safe procedure. However, problems may occur, including: Infection. Bleeding. Allergic reactions to medicines. Damage to nearby structures or organs. A stone remaining in the common bile duct. The common bile duct carries bile from the gallbladder into the small intestine. A bile leak from the cyst duct that is clipped when your gallbladder is removed. What happens before the procedure? Medicines Ask your health care provider about: Changing or stopping your  regular medicines. This is especially important if you are taking diabetes medicines or blood thinners. Taking medicines such as aspirin and ibuprofen. These medicines can thin your blood. Do not take these medicines unless your health care provider tells you to take them. Taking over-the-counter medicines, vitamins, herbs, and supplements. General instructions Let your health care provider know if you develop a cold or an infection before surgery. Plan to have someone take you home from the hospital or clinic. If you will be going home right after the procedure, plan to have someone with you for 24 hours. Ask your health care provider: How your surgery site will be marked. What steps will be taken to help prevent infection. These may include: Removing hair at the surgery site. Washing skin with a germ-killing soap. Taking antibiotic medicine. What happens during the procedure?  An IV will be inserted into one of your veins. You will be given one or both of the following: A medicine to help you relax (sedative). A medicine to make you fall asleep (general anesthetic). A breathing tube will be placed in your mouth. Your surgeon will make several small incisions in your abdomen. The laparoscope will be inserted through one of the small incisions. The camera on the laparoscope will send images to a monitor in the operating room. This lets your surgeon see inside your abdomen. A gas will be pumped into your abdomen. This will expand your abdomen to give the surgeon more room to perform the surgery. Other tools that are needed for the procedure will be inserted through the other incisions. The gallbladder will be removed through one of the incisions. Your common bile duct may be examined. If stones are found in the common bile duct, they may be  removed. After your gallbladder has been removed, the incisions will be closed with stitches (sutures), staples, or skin glue. Your incisions may be  covered with a bandage (dressing). The procedure may vary among health care providers and hospitals. What happens after the procedure? Your blood pressure, heart rate, breathing rate, and blood oxygen level will be monitored until you leave the hospital or clinic. You will be given medicines as needed to control your pain. If you were given a sedative during the procedure, it can affect you for several hours. Do not drive or operate machinery until your health care provider says that it is safe. Summary Minimally invasive cholecystectomy, also called laparoscopic cholecystectomy, is surgery to remove the gallbladder using small incisions. Tell your health care provider about all the medical conditions you have and all the medicines you are taking for those conditions. Before the procedure, follow instructions about eating or drinking restrictions and changing or stopping medicines. If you were given a sedative during the procedure, it can affect you for several hours. Do not drive or operate machinery until your health care provider says that it is safe. This information is not intended to replace advice given to you by your health care provider. Make sure you discuss any questions you have with your health care provider.  Cholelithiasis (Sludge = sand size stones) Cholelithiasis is a disease in which gallstones form in the gallbladder. The gallbladder is an organ that stores bile. Bile is a fluid that helps to digest fats. Gallstones begin as small crystals and can slowly grow into stones. They may cause no symptoms until they block the gallbladder duct, or cystic duct, when the gallbladder tightens (contracts) after food is eaten. This can cause pain and is known as a gallbladder attack, or biliary colic. There are two main types of gallstones: Cholesterol stones. These are the most common type of gallstone. These stones are made of hardened cholesterol and are usually yellow-green in color.  Cholesterol is a fat-like substance that is made in the liver. Pigment stones. These are dark in color and are made of a red-yellow substance, called bilirubin,that forms when hemoglobin from red blood cells breaks down. What are the causes? This condition may be caused by an imbalance in the different parts that make bile. This can happen if the bile: Has too much bilirubin. This can happen in certain blood diseases, such as sickle cell anemia. Has too much cholesterol. Does not have enough bile salts. These salts help the body absorb and digest fats. In some cases, this condition can also be caused by the gallbladder not emptying completely or often enough. This is common during pregnancy. What increases the risk? The following factors may make you more likely to develop this condition: Being female. Having multiple pregnancies. Health care providers sometimes advise removing diseased gallbladders before future pregnancies. Eating a diet that is heavy in fried foods, fat, and refined carbohydrates, such as white bread and white rice. Being obese. Being older than age 19. Using medicines that contain female hormones (estrogen) for a long time. Losing weight quickly. Having a family history of gallstones. Having certain medical problems, such as: Diabetes mellitus. Cystic fibrosis. Crohn's disease. Cirrhosis or other long-term (chronic) liver disease. Certain blood diseases, such as sickle cell anemia or leukemia. What are the signs or symptoms? In many cases, having gallstones causes no symptoms. When you have gallstones but do not have symptoms, you have silent gallstones. If a gallstone blocks your bile duct, it can  cause a gallbladder attack. The main symptom of a gallbladder attack is sudden pain in the upper right part of the abdomen. The pain: Usually comes at night or after eating. Can last for one hour or more. Can spread to your right shoulder, back, or chest. Can feel like  indigestion. This is discomfort, burning, or fullness in your upper abdomen. If the bile duct is blocked for more than a few hours, it can cause an infection or inflammation of your gallbladder (cholecystitis), liver, or pancreas. This can cause: Nausea or vomiting. Bloating. Pain in your abdomen that lasts for 5 hours or longer. Tenderness in your upper abdomen, often in the upper right section and under your rib cage. Fever or chills. Skin or the white parts of your eyes turning yellow (jaundice). This usually happens when a stone has blocked bile from passing through the common bile duct. Dark urine or light-colored stools. How is this diagnosed? This condition may be diagnosed based on: A physical exam. Your medical history. Ultrasound. CT scan. MRI. You may also have other tests, including: Blood tests to check for signs of an infection or inflammation. Cholescintigraphy, or HIDA scan. This is a scan of your gallbladder and bile ducts (biliary system) using non-harmful radioactive material and special cameras that can see the radioactive material. Endoscopic retrograde cholangiopancreatogram. This involves inserting a small tube with a camera on the end (endoscope) through your mouth to look at bile ducts and check for blockages. How is this treated? Treatment for this condition depends on the severity of the condition. Silent gallstones do not need treatment. Treatment may be needed if a blockage causes a gallbladder attack or other symptoms. Treatment may include: Home care, if symptoms are not severe. During a simple gallbladder attack, stop eating and drinking for 12-24 hours (except for water and clear liquids). This helps to "cool down" your gallbladder. After 1 or 2 days, you can start to eat a diet of simple or clear foods, such as broths and crackers. You may also need medicines for pain or nausea or both. If you have cholecystitis and an infection, you will need  antibiotics. A hospital stay, if needed for pain control or for cholecystitis with severe infection. Cholecystectomy, or surgery to remove your gallbladder. This is the most common treatment if all other treatments have not worked. Medicines to break up gallstones. These are most effective at treating small gallstones. Medicines may be used for up to 6-12 months. Endoscopic retrograde cholangiopancreatogram. A small basket can be attached to the endoscope and used to capture and remove gallstones, mainly those that are in the common bile duct. Follow these instructions at home: Medicines Take over-the-counter and prescription medicines only as told by your health care provider. If you were prescribed an antibiotic medicine, take it as told by your health care provider. Do not stop taking the antibiotic even if you start to feel better. Ask your health care provider if the medicine prescribed to you requires you to avoid driving or using machinery. Eating and drinking Drink enough fluid to keep your urine pale yellow. This is important during a gallbladder attack. Water and clear liquids are preferred. Follow a healthy diet. This includes: Reducing fatty foods, such as fried food and foods high in cholesterol. Reducing refined carbohydrates, such as white bread and white rice. Eating more fiber. Aim for foods such as almonds, fruit, and beans. Alcohol use If you drink alcohol: Limit how much you use to: 0-1 drink a day  for nonpregnant women. 0-2 drinks a day for men. Be aware of how much alcohol is in your drink. In the [place] one drink equals one 12 oz bottle of beer (355 mL), one 5 oz glass of wine (148 mL), or one 1 oz glass of hard liquor (44 mL). General instructions Do not use any products that contain nicotine or tobacco, such as cigarettes, e-cigarettes, and chewing tobacco. If you need help quitting, ask your health care provider. Maintain a healthy weight. Keep all follow-up visits  as told by your health care provider. These may include consultations with a surgeon or specialist. This is important. Where to find more information [employer] of Diabetes and Digestive and Kidney Diseases: [email or web address] Contact a health care provider if: You think you have had a gallbladder attack. You have been diagnosed with silent gallstones and you develop pain in your abdomen or indigestion. You begin to have attacks more often. You have dark urine or light-colored stools. Get help right away if: You have pain from a gallbladder attack that lasts for more than 2 hours. You have pain in your abdomen that lasts for more than 5 hours or is getting worse. You have a fever or chills. You have nausea and vomiting that do not go away. You develop jaundice. Summary Cholelithiasis is a disease in which gallstones form in the gallbladder. This condition may be caused by an imbalance in the different parts that make bile. This can happen if your bile has too much bilirubin or cholesterol, or does not have enough bile salts. Treatment for gallstones depends on the severity of the condition. Silent gallstones do not need treatment. If gallstones cause a gallbladder attack or other symptoms, treatment usually involves not eating or drinking anything. Treatment may also include pain medicines and antibiotics, and it sometimes includes a hospital stay. Surgery to remove the gallbladder is common if all other treatments have not worked. This information is not intended to replace advice given to you by your health care provider. Make sure you discuss any questions you have with your health care provider. Document Revised: 11/24/2018 Document Reviewed: 11/24/2018 Elsevier Patient Education  2022 [employer].

## 2020-11-05 NOTE — H&P (Signed)
[facility] History and Physical  Reason for Referral: Gallbladder  Referring Physician:  Dettinger, Fransisca Kaufmann, MD   Chief Complaint   New Patient (Initial Visit)     Jenna Mendoza is a 30 y.o. female.  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 She has been having pain in her right side and right back and associated nausea. She had an episode where she had to be brought to the hospital by EMS due to a panic attack that came on with the pain. Her abdominal pain hit her all of a sudden and she had a severe anxiety attack and could not move. She had resolution of her abdominal pain by the time she made it to the hospital but was worked up for her anxiety attack. She had eaten a bacon egg biscuit that morning. Since that time she has avoided greasy or fatty foods. Overall she is feeling ok but is nervous about repeat episodes.   Past Medical History:  Diagnosis Date   Allergy    shellfish   Degenerative joint disease 2008   Postpartum care following vaginal delivery (11/6) 11/21/2015   Synovial chondromatoses    Urinary tract infection during pregnancy mid december 2012    Past Surgical History:  Procedure Laterality Date   SHOULDER SURGERY Right     Family History  Problem Relation Age of Onset   Diabetes Mellitus II Paternal Grandfather    Hypertension Father    Breast cancer Maternal Grandmother 3   Schizophrenia Mother    Bipolar disorder Mother    Breast cancer Mother 66   Epilepsy Brother    Anesthesia problems Neg Hx    Hypotension Neg Hx    Malignant hyperthermia Neg Hx    Pseudochol deficiency Neg Hx     Social History   Tobacco Use   Smoking status: Never   Smokeless tobacco: Never  Vaping Use   Vaping Use: Never used  Substance Use Topics   Alcohol use: Not Currently    Alcohol/week: 1.0 standard drink    Types: 1 Standard drinks or equivalent per week    Comment: not while preg   Drug use: No    Medications: I  have reviewed the patient's current medications. Allergies as of 11/03/2020       Reactions   Shellfish Allergy Anaphylaxis, Swelling        Medication List        Accurate as of November 03, 2020 11:12 AM. If you have any questions, ask your nurse or doctor.          acetaminophen 500 MG tablet Commonly known as: TYLENOL Take 1,000 mg by mouth every 6 (six) hours as needed for mild pain or headache.   buPROPion 300 MG 24 hr tablet Commonly known as: WELLBUTRIN XL Take 1 tablet (300 mg total) by mouth daily.   Mirena (52 MG) 20 MCG/DAY Iud Generic drug: levonorgestrel 1 Intra Uterine Device by Intrauterine route continuous as needed.   Ozempic (0.25 or 0.5 MG/DOSE) 2 MG/1.5ML Sopn Generic drug: Semaglutide(0.25 or 0.5MG /DOS) Inject 0.5 mg into the skin once a week.         ROS:  A comprehensive review of systems was negative except for: Gastrointestinal: positive for abdominal pain, nausea, and reflux symptoms Musculoskeletal: positive for back pain  Blood pressure 110/75, pulse 69, temperature 98.2 F (36.8 C), temperature source Other (Comment), resp. rate 12, height 5\' 6"  (1.676 m), weight 149 lb (  67.6 kg), last menstrual period 10/20/2020, SpO2 97 %, unknown if currently breastfeeding. Physical Exam Vitals reviewed.  Constitutional:      Appearance: Normal appearance.  HENT:     Head: Normocephalic.     Nose: Nose normal.     Mouth/Throat:     Mouth: Mucous membranes are moist.  Eyes:     Extraocular Movements: Extraocular movements intact.  Cardiovascular:     Rate and Rhythm: Normal rate and regular rhythm.  Pulmonary:     Effort: Pulmonary effort is normal.     Breath sounds: Normal breath sounds.  Abdominal:     General: There is no distension.     Palpations: Abdomen is soft.     Tenderness: There is abdominal tenderness in the right upper quadrant.  Musculoskeletal:        General: Normal range of motion.     Cervical back: Normal range of  motion.  Skin:    General: Skin is warm.  Neurological:     General: No focal deficit present.     Mental Status: She is alert and oriented to person, place, and time.  Psychiatric:        Mood and Affect: Mood normal.        Behavior: Behavior normal.        Thought Content: Thought content normal.    Results: CLINICAL DATA:  Right upper quadrant pain   EXAM: ULTRASOUND ABDOMEN LIMITED RIGHT UPPER QUADRANT   COMPARISON:  None.   FINDINGS: Gallbladder:   There is layering sludge and small stones within gallbladder lumen. The gallbladder wall is not thickened, allowing for under distension. There is no pericholecystic fluid. Sonographic Percell Miller sign was reported negative.   Common bile duct:   Diameter: 3 mm   Liver:   No focal lesion identified. Within normal limits in parenchymal echogenicity. Portal vein is patent on color Doppler imaging with normal direction of blood flow towards the liver.   Other: None.   IMPRESSION: Cholelithiasis and gallbladder sludge without evidence of acute cholecystitis.     Electronically Signed   By: Valetta Mole M.D.   On: 10/27/2020 13:04  Assessment & Plan:  Jenna Mendoza is a 30 y.o. female with gallstones that have caused her some significant pain during her episodes.  PLAN: I counseled the patient about the indication, risks and benefits of laparoscopic cholecystectomy versus [place] assisted cholecystectomy if the equipment is available for her surgery date.  She understands there is a very small chance for bleeding, infection, injury to normal structures (including common bile duct), conversion to open surgery, persistent symptoms, evolution of postcholecystectomy diarrhea, need for secondary interventions, anesthesia reaction, cardiopulmonary issues and other risks not specifically detailed here. I described the expected recovery, the plan for follow-up and the restrictions during the recovery phase.  All  questions were answered.   All questions were answered to the satisfaction of the patient.    Virl Cagey 11/03/2020, 11:12 AM

## 2020-11-07 ENCOUNTER — Telehealth (INDEPENDENT_AMBULATORY_CARE_PROVIDER_SITE_OTHER): Payer: BC Managed Care – PPO | Admitting: General Surgery

## 2020-11-07 DIAGNOSIS — K8020 Calculus of gallbladder without cholecystitis without obstruction: Secondary | ICD-10-CM

## 2020-11-07 NOTE — Telephone Encounter (Signed)
[facility]  Robot will not be delivered by 11/7, plan for laparoscopic cholecystectomy. Left patient a VM.   Curlene Labrum, MD [facility] [address] [place], [place] [address] [phone] (office)

## 2020-11-14 ENCOUNTER — Other Ambulatory Visit: Payer: Self-pay

## 2020-11-14 ENCOUNTER — Encounter (HOSPITAL_COMMUNITY): Payer: Self-pay | Admitting: Emergency Medicine

## 2020-11-14 ENCOUNTER — Encounter (HOSPITAL_COMMUNITY): Payer: Self-pay

## 2020-11-14 ENCOUNTER — Emergency Department (HOSPITAL_COMMUNITY)
Admission: EM | Admit: 2020-11-14 | Discharge: 2020-11-14 | Disposition: A | Discharge status: Home / Self Care | Payer: BC Managed Care – PPO | Attending: Physician Assistant | Admitting: Physician Assistant

## 2020-11-14 ENCOUNTER — Encounter (HOSPITAL_COMMUNITY)
Admission: RE | Admit: 2020-11-14 | Discharge: 2020-11-14 | Disposition: A | Discharge status: Home / Self Care | Payer: BC Managed Care – PPO | Source: Ambulatory Visit | Attending: General Surgery | Admitting: General Surgery

## 2020-11-14 ENCOUNTER — Emergency Department (HOSPITAL_COMMUNITY): Payer: BC Managed Care – PPO

## 2020-11-14 VITALS — Ht 66.0 in | Wt 149.0 lb

## 2020-11-14 DIAGNOSIS — Z5321 Procedure and treatment not carried out due to patient leaving prior to being seen by health care provider: Secondary | ICD-10-CM | POA: Insufficient documentation

## 2020-11-14 DIAGNOSIS — Z01818 Encounter for other preprocedural examination: Secondary | ICD-10-CM

## 2020-11-14 DIAGNOSIS — R1110 Vomiting, unspecified: Secondary | ICD-10-CM | POA: Insufficient documentation

## 2020-11-14 DIAGNOSIS — R1010 Upper abdominal pain, unspecified: Secondary | ICD-10-CM | POA: Insufficient documentation

## 2020-11-14 LAB — CBC WITH DIFFERENTIAL/PLATELET
Abs Immature Granulocytes: 0.01 10*3/uL (ref 0.00–0.07)
Basophils Absolute: 0 10*3/uL (ref 0.0–0.1)
Basophils Relative: 1 %
Eosinophils Absolute: 0.1 10*3/uL (ref 0.0–0.5)
Eosinophils Relative: 2 %
HCT: 38.8 % (ref 36.0–46.0)
Hemoglobin: 12.8 g/dL (ref 12.0–15.0)
Immature Granulocytes: 0 %
Lymphocytes Relative: 38 %
Lymphs Abs: 2.5 10*3/uL (ref 0.7–4.0)
MCH: 29.9 pg (ref 26.0–34.0)
MCHC: 33 g/dL (ref 30.0–36.0)
MCV: 90.7 fL (ref 80.0–100.0)
Monocytes Absolute: 0.4 10*3/uL (ref 0.1–1.0)
Monocytes Relative: 6 %
Neutro Abs: 3.5 10*3/uL (ref 1.7–7.7)
Neutrophils Relative %: 53 %
Platelets: 225 10*3/uL (ref 150–400)
RBC: 4.28 MIL/uL (ref 3.87–5.11)
RDW: 12.7 % (ref 11.5–15.5)
WBC: 6.5 10*3/uL (ref 4.0–10.5)
nRBC: 0 % (ref 0.0–0.2)

## 2020-11-14 LAB — COMPREHENSIVE METABOLIC PANEL
ALT: 150 U/L — ABNORMAL HIGH (ref 0–44)
AST: 45 U/L — ABNORMAL HIGH (ref 15–41)
Albumin: 3.6 g/dL (ref 3.5–5.0)
Alkaline Phosphatase: 69 U/L (ref 38–126)
Anion gap: 6 (ref 5–15)
BUN: 7 mg/dL (ref 6–20)
CO2: 24 mmol/L (ref 22–32)
Calcium: 8.8 mg/dL — ABNORMAL LOW (ref 8.9–10.3)
Chloride: 108 mmol/L (ref 98–111)
Creatinine, Ser: 0.64 mg/dL (ref 0.44–1.00)
GFR, Estimated: >60 mL/min (ref >60)
Glucose, Bld: 94 mg/dL (ref 70–99)
Potassium: 3.5 mmol/L (ref 3.5–5.1)
Sodium: 138 mmol/L (ref 135–145)
Total Bilirubin: 0.5 mg/dL (ref 0.3–1.2)
Total Protein: 6.5 g/dL (ref 6.5–8.1)

## 2020-11-14 LAB — LIPASE, BLOOD: Lipase: 32 U/L (ref 11–51)

## 2020-11-14 NOTE — ED Triage Notes (Signed)
Patient reports persistent mid/upper abdominal pain with emesis for several days scheduled for cholecystectomy on 11/21/20 .

## 2020-11-14 NOTE — ED Notes (Signed)
Pt called for vital recheck 3x. No response, pt not here at this time.

## 2020-11-14 NOTE — ED Provider Notes (Signed)
Emergency Medicine Provider Triage Evaluation Note  Jenna Mendoza , a 30 y.o. female  was evaluated in triage.  Pt complains of severe right upper quadrant abdominal pain.  Patient reports several weeks ago she was diagnosed with gallstones.  Reports last 3 days she has had 3 gallbladder attacks.  She reports when it happens the pain is 10/10 and unbearable.  She reports it usually lasts approximately an hour and then improves.  No specific food triggers.  No treatments prior to arrival.  Patient has seen general surgery and does have elective cholecystectomy scheduled.  Review of Systems  Positive: Abdominal pain nausea and vomiting Negative: Fever, chills  Physical Exam  BP 123/74   Pulse 68   Temp 98.5 F (36.9 C)   Resp 16   LMP 10/20/2020   SpO2 100%  Gen:   Awake, no distress   Resp:  Normal effort  MSK:   Moves extremities without difficulty  Other:  Tender to palpation in the right upper quadrant of the abdomen  Medical Decision Making  Medically screening exam initiated at 5:14 AM.  Appropriate orders placed.  Jenna Mendoza was informed that the remainder of the evaluation will be completed by another provider, this initial triage assessment does not replace that evaluation, and the importance of remaining in the ED until their evaluation is complete.  Concern for possible cholecystitis.   Agapito Games 11/14/20 2952    Orpah Greek, MD 11/14/20 0600

## 2020-11-15 ENCOUNTER — Ambulatory Visit (HOSPITAL_COMMUNITY): Payer: BC Managed Care – PPO | Admitting: Anesthesiology

## 2020-11-15 ENCOUNTER — Encounter (HOSPITAL_COMMUNITY): Payer: Self-pay | Admitting: General Surgery

## 2020-11-15 ENCOUNTER — Ambulatory Visit (HOSPITAL_COMMUNITY)
Admission: RE | Admit: 2020-11-15 | Discharge: 2020-11-15 | Disposition: A | Discharge status: Home / Self Care | Payer: BC Managed Care – PPO | Attending: General Surgery | Admitting: General Surgery

## 2020-11-15 ENCOUNTER — Encounter (HOSPITAL_COMMUNITY)
Admission: RE | Disposition: A | Discharge status: Home / Self Care | Payer: Self-pay | Source: Home / Self Care | Attending: General Surgery

## 2020-11-15 DIAGNOSIS — K8020 Calculus of gallbladder without cholecystitis without obstruction: Secondary | ICD-10-CM | POA: Diagnosis present

## 2020-11-15 DIAGNOSIS — Z8249 Family history of ischemic heart disease and other diseases of the circulatory system: Secondary | ICD-10-CM | POA: Diagnosis not present

## 2020-11-15 DIAGNOSIS — K8010 Calculus of gallbladder with chronic cholecystitis without obstruction: Secondary | ICD-10-CM | POA: Diagnosis present

## 2020-11-15 DIAGNOSIS — Z833 Family history of diabetes mellitus: Secondary | ICD-10-CM | POA: Insufficient documentation

## 2020-11-15 DIAGNOSIS — Z803 Family history of malignant neoplasm of breast: Secondary | ICD-10-CM | POA: Insufficient documentation

## 2020-11-15 DIAGNOSIS — Z01818 Encounter for other preprocedural examination: Secondary | ICD-10-CM

## 2020-11-15 DIAGNOSIS — Z91013 Allergy to seafood: Secondary | ICD-10-CM | POA: Insufficient documentation

## 2020-11-15 HISTORY — PX: CHOLECYSTECTOMY: SHX55

## 2020-11-15 LAB — PREGNANCY, URINE: Preg Test, Ur: NEGATIVE

## 2020-11-15 SURGERY — LAPAROSCOPIC CHOLECYSTECTOMY
Anesthesia: General | Site: Abdomen

## 2020-11-15 MED ORDER — KETOROLAC TROMETHAMINE 30 MG/ML IJ SOLN
30.0000 mg | Freq: Once | INTRAMUSCULAR | Status: AC
  Administered 2020-11-15: 30 mg via INTRAVENOUS

## 2020-11-15 MED ORDER — DEXMEDETOMIDINE (PRECEDEX) IN NS 20 MCG/5ML (4 MCG/ML) IV SYRINGE
PREFILLED_SYRINGE | INTRAVENOUS | Status: AC
  Filled 2020-11-15: qty 5

## 2020-11-15 MED ORDER — LIDOCAINE HCL (PF) 2 % IJ SOLN
INTRAMUSCULAR | Status: AC
  Filled 2020-11-15: qty 25

## 2020-11-15 MED ORDER — ONDANSETRON HCL 4 MG/2ML IJ SOLN
INTRAMUSCULAR | Status: DC | PRN
  Administered 2020-11-15: 4 mg via INTRAVENOUS

## 2020-11-15 MED ORDER — PROPOFOL 10 MG/ML IV BOLUS
INTRAVENOUS | Status: DC | PRN
  Administered 2020-11-15: 200 mg via INTRAVENOUS

## 2020-11-15 MED ORDER — ROCURONIUM BROMIDE 100 MG/10ML IV SOLN
INTRAVENOUS | Status: DC | PRN
  Administered 2020-11-15: 50 mg via INTRAVENOUS

## 2020-11-15 MED ORDER — HEMOSTATIC AGENTS (NO CHARGE) OPTIME
TOPICAL | Status: DC | PRN
  Administered 2020-11-15: 1 via TOPICAL

## 2020-11-15 MED ORDER — SODIUM CHLORIDE 0.9 % IR SOLN
Status: DC | PRN
  Administered 2020-11-15: 1000 mL

## 2020-11-15 MED ORDER — BUPIVACAINE HCL (PF) 0.5 % IJ SOLN
INTRAMUSCULAR | Status: DC | PRN
  Administered 2020-11-15: 10 mL

## 2020-11-15 MED ORDER — LACTATED RINGERS IV SOLN
INTRAVENOUS | Status: DC
  Administered 2020-11-15: 1000 mL via INTRAVENOUS

## 2020-11-15 MED ORDER — METOPROLOL TARTRATE 5 MG/5ML IV SOLN
INTRAVENOUS | Status: AC
  Filled 2020-11-15: qty 5

## 2020-11-15 MED ORDER — ONDANSETRON HCL 4 MG/2ML IJ SOLN: 4.0000 mg | Freq: Once | INTRAMUSCULAR | Status: DC | PRN

## 2020-11-15 MED ORDER — FENTANYL CITRATE PF 50 MCG/ML IJ SOSY
25.0000 ug | PREFILLED_SYRINGE | INTRAMUSCULAR | Status: DC | PRN
  Administered 2020-11-15: 50 ug via INTRAVENOUS
  Filled 2020-11-15: qty 1

## 2020-11-15 MED ORDER — DEXAMETHASONE SODIUM PHOSPHATE 10 MG/ML IJ SOLN
INTRAMUSCULAR | Status: AC
  Filled 2020-11-15: qty 1

## 2020-11-15 MED ORDER — ONDANSETRON HCL 4 MG PO TABS: 4.0000 mg | ORAL_TABLET | Freq: Three times a day (TID) | ORAL | 1 refills | Status: DC | PRN

## 2020-11-15 MED ORDER — OXYCODONE HCL 5 MG PO TABS: 5.0000 mg | ORAL_TABLET | ORAL | 0 refills | Status: DC | PRN

## 2020-11-15 MED ORDER — LIDOCAINE HCL (CARDIAC) PF 50 MG/5ML IV SOSY
PREFILLED_SYRINGE | INTRAVENOUS | Status: DC | PRN
  Administered 2020-11-15: 60 mg via INTRAVENOUS

## 2020-11-15 MED ORDER — MIDAZOLAM HCL 2 MG/2ML IJ SOLN
INTRAMUSCULAR | Status: DC | PRN
  Administered 2020-11-15: 2 mg via INTRAVENOUS

## 2020-11-15 MED ORDER — FENTANYL CITRATE (PF) 100 MCG/2ML IJ SOLN
INTRAMUSCULAR | Status: DC | PRN
  Administered 2020-11-15: 150 ug via INTRAVENOUS
  Administered 2020-11-15 (×3): 50 ug via INTRAVENOUS

## 2020-11-15 MED ORDER — FENTANYL CITRATE (PF) 100 MCG/2ML IJ SOLN
INTRAMUSCULAR | Status: AC
  Filled 2020-11-15: qty 2

## 2020-11-15 MED ORDER — SCOPOLAMINE 1 MG/3DAYS TD PT72
1.0000 | MEDICATED_PATCH | Freq: Once | TRANSDERMAL | Status: DC
  Administered 2020-11-15: 1.5 mg via TRANSDERMAL
  Filled 2020-11-15: qty 1

## 2020-11-15 MED ORDER — SUGAMMADEX SODIUM 500 MG/5ML IV SOLN
INTRAVENOUS | Status: DC | PRN
Start: 2020-11-15 — End: 2020-11-15
  Administered 2020-11-15: 200 mg via INTRAVENOUS

## 2020-11-15 MED ORDER — BUPIVACAINE HCL (PF) 0.5 % IJ SOLN
INTRAMUSCULAR | Status: AC
  Filled 2020-11-15: qty 30

## 2020-11-15 MED ORDER — ROCURONIUM BROMIDE 10 MG/ML (PF) SYRINGE
PREFILLED_SYRINGE | INTRAVENOUS | Status: AC
  Filled 2020-11-15: qty 40

## 2020-11-15 MED ORDER — ORAL CARE MOUTH RINSE: 15.0000 mL | Freq: Once | OROMUCOSAL | Status: AC

## 2020-11-15 MED ORDER — MIDAZOLAM HCL 2 MG/2ML IJ SOLN
INTRAMUSCULAR | Status: AC
  Filled 2020-11-15: qty 2

## 2020-11-15 MED ORDER — CHLORHEXIDINE GLUCONATE CLOTH 2 % EX PADS: 6.0000 | MEDICATED_PAD | Freq: Once | CUTANEOUS | Status: DC

## 2020-11-15 MED ORDER — CHLORHEXIDINE GLUCONATE 0.12 % MT SOLN
15.0000 mL | Freq: Once | OROMUCOSAL | Status: AC
  Administered 2020-11-15: 15 mL via OROMUCOSAL

## 2020-11-15 MED ORDER — ONDANSETRON HCL 4 MG/2ML IJ SOLN
INTRAMUSCULAR | Status: AC
  Filled 2020-11-15: qty 2

## 2020-11-15 MED ORDER — DEXAMETHASONE SODIUM PHOSPHATE 10 MG/ML IJ SOLN
INTRAMUSCULAR | Status: DC | PRN
  Administered 2020-11-15: 10 mg via INTRAVENOUS

## 2020-11-15 MED ORDER — SODIUM CHLORIDE 0.9 % IV SOLN
2.0000 g | INTRAVENOUS | Status: AC
  Administered 2020-11-15: 2 g via INTRAVENOUS
  Filled 2020-11-15: qty 2

## 2020-11-15 MED ORDER — KETOROLAC TROMETHAMINE 30 MG/ML IJ SOLN
INTRAMUSCULAR | Status: AC
  Filled 2020-11-15: qty 1

## 2020-11-15 SURGICAL SUPPLY — 40 items
ADH SKN CLS APL DERMABOND .7 (GAUZE/BANDAGES/DRESSINGS) ×1
APL PRP STRL LF DISP 70% ISPRP (MISCELLANEOUS) ×1
APPLIER CLIP ROT 10 11.4 M/L (STAPLE) ×2
APR CLP MED LRG 11.4X10 (STAPLE) ×1
BAG RETRIEVAL 10 (BASKET) ×1
BLADE SURG 15 STRL LF DISP TIS (BLADE) ×1 IMPLANT
BLADE SURG 15 STRL SS (BLADE) ×2
CHLORAPREP W/TINT 26 (MISCELLANEOUS) ×2 IMPLANT
CLIP APPLIE ROT 10 11.4 M/L (STAPLE) ×1 IMPLANT
CLOTH BEACON ORANGE TIMEOUT ST (SAFETY) ×2 IMPLANT
COVER LIGHT HANDLE STERIS (MISCELLANEOUS) ×4 IMPLANT
DERMABOND ADVANCED (GAUZE/BANDAGES/DRESSINGS) ×1
DERMABOND ADVANCED .7 DNX12 (GAUZE/BANDAGES/DRESSINGS) ×1 IMPLANT
ELECT REM PT RETURN 9FT ADLT (ELECTROSURGICAL) ×2
ELECTRODE REM PT RTRN 9FT ADLT (ELECTROSURGICAL) ×1 IMPLANT
GLOVE SURG ENC MOIS LTX SZ6.5 (GLOVE) ×2 IMPLANT
GLOVE SURG POLYISO LF SZ7 (GLOVE) ×2 IMPLANT
GLOVE SURG UNDER POLY LF SZ6.5 (GLOVE) ×2 IMPLANT
GLOVE SURG UNDER POLY LF SZ7 (GLOVE) ×6 IMPLANT
GOWN STRL REUS W/TWL LRG LVL3 (GOWN DISPOSABLE) ×6 IMPLANT
HEMOSTAT SNOW SURGICEL 2X4 (HEMOSTASIS) ×2 IMPLANT
INST SET LAPROSCOPIC AP (KITS) ×2 IMPLANT
KIT TURNOVER KIT A (KITS) ×2 IMPLANT
MANIFOLD NEPTUNE II (INSTRUMENTS) ×2 IMPLANT
NEEDLE INSUFFLATION 14GA 120MM (NEEDLE) ×2 IMPLANT
NS IRRIG 1000ML POUR BTL (IV SOLUTION) ×2 IMPLANT
PACK LAP CHOLE LZT030E (CUSTOM PROCEDURE TRAY) ×2 IMPLANT
PAD ARMBOARD 7.5X6 YLW CONV (MISCELLANEOUS) ×2 IMPLANT
SET BASIN LINEN APH (SET/KITS/TRAYS/PACK) ×2 IMPLANT
SET TUBE SMOKE EVAC HIGH FLOW (TUBING) ×2 IMPLANT
SLEEVE ENDOPATH XCEL 5M (ENDOMECHANICALS) ×2 IMPLANT
SUT MNCRL AB 4-0 PS2 18 (SUTURE) ×2 IMPLANT
SUT VICRYL 0 UR6 27IN ABS (SUTURE) ×2 IMPLANT
SYS BAG RETRIEVAL 10MM (BASKET) ×1
SYSTEM BAG RETRIEVAL 10MM (BASKET) ×1 IMPLANT
TROCAR ENDO BLADELESS 11MM (ENDOMECHANICALS) ×2 IMPLANT
TROCAR XCEL NON-BLD 5MMX100MML (ENDOMECHANICALS) ×2 IMPLANT
TROCAR XCEL UNIV SLVE 11M 100M (ENDOMECHANICALS) ×2 IMPLANT
TUBE CONNECTING 12X1/4 (SUCTIONS) ×2 IMPLANT
WARMER LAPAROSCOPE (MISCELLANEOUS) ×2 IMPLANT

## 2020-11-15 NOTE — Discharge Instructions (Signed)
Discharge Laparoscopic Surgery Instructions:  Common Complaints: Right shoulder pain is common after laparoscopic surgery. This is secondary to the gas used in the surgery being trapped under the diaphragm.  Walk to help your body absorb the gas. This will improve in a few days. Pain at the port sites are common, especially the larger port sites. This will improve with time.  Some nausea is common and poor appetite. The main goal is to stay hydrated the first few days after surgery.   Diet/ Activity: Diet as tolerated. You may not have an appetite, but it is important to stay hydrated. Drink 64 ounces of water a day. Your appetite will return with time.  Shower per your regular routine daily.  Do not take hot showers. Take warm showers that are less than 10 minutes. Rest and listen to your body, but do not remain in bed all day.  Walk everyday for at least 15-20 minutes. Deep cough and move around every 1-2 hours in the first few days after surgery.  Do not lift > 10 lbs, perform excessive bending, pushing, pulling, squatting for 1-2 weeks after surgery.  Do not pick at the dermabond glue on your incision sites.  This glue film will remain in place for 1-2 weeks and will start to peel off.  Do not place lotions or balms on your incision unless instructed to specifically by Dr. Bliss Behnke.   Pain Expectations and Narcotics: -After surgery you will have pain associated with your incisions and this is normal. The pain is muscular and nerve pain, and will get better with time. -You are encouraged and expected to take non narcotic medications like tylenol and ibuprofen (when able) to treat pain as multiple modalities can aid with pain treatment. -Narcotics are only used when pain is severe or there is breakthrough pain. -You are not expected to have a pain score of 0 after surgery, as we cannot prevent pain. A pain score of 3-4 that allows you to be functional, move, walk, and tolerate some activity is  the goal. The pain will continue to improve over the days after surgery and is dependent on your surgery. -Due to [place] law, we are only able to give a certain amount of pain medication to treat post operative pain, and we only give additional narcotics on a patient by patient basis.  -For most laparoscopic surgery, studies have shown that the majority of patients only need 10-15 narcotic pills, and for open surgeries most patients only need 15-20.   -Having appropriate expectations of pain and knowledge of pain management with non narcotics is important as we do not want anyone to become addicted to narcotic pain medication.  -Using ice packs in the first 48 hours and heating pads after 48 hours, wearing an abdominal binder (when recommended), and using over the counter medications are all ways to help with pain management.   -Simple acts like meditation and mindfulness practices after surgery can also help with pain control and research has proven the benefit of these practices.  Medication: Take tylenol and ibuprofen as needed for pain control, alternating every 4-6 hours.  Example:  Tylenol 1000mg @ 6am, 12noon, 6pm, 12midnight (Do not exceed 4000mg of tylenol a day). Ibuprofen 800mg @ 9am, 3pm, 9pm, 3am (Do not exceed 3600mg of ibuprofen a day).  Take Roxicodone for breakthrough pain every 4 hours.  Take Colace for constipation related to narcotic pain medication. If you do not have a bowel movement in 2 days, take Miralax   over the counter.  Drink plenty of water to also prevent constipation.   Contact Information: If you have questions or concerns, please call our office, [phone], Monday- Thursday 8AM-5PM and Friday 8AM-12Noon.  If it is after hours or on the weekend, please call [facility], [phone], [phone], and ask to speak to the surgeon on call for Dr. Sameria Morss at [place].   

## 2020-11-15 NOTE — Interval H&P Note (Signed)
History and Physical Interval Note:  11/15/2020 1:02 PM  Jenna Mendoza  has presented today for surgery, with the diagnosis of Cholelithiasis.  The various methods of treatment have been discussed with the patient and family. After consideration of risks, benefits and other options for treatment, the patient has consented to  Procedure(s): LAPAROSCOPIC CHOLECYSTECTOMY (N/A) as a surgical intervention.  The patient's history has been reviewed, patient examined, no change in status, stable for surgery.  I have reviewed the patient's chart and labs.  Questions were answered to the patient's satisfaction.     Virl Cagey

## 2020-11-15 NOTE — Progress Notes (Signed)
[facility]  Updated husband, Rx to [facility]. Patient can notify office with need for any work note/ once she decides about return to work.  Curlene Labrum, MD [facility] [address] [place], [place] [address] [phone] (office)

## 2020-11-15 NOTE — Anesthesia Procedure Notes (Signed)
Procedure Name: Intubation Date/Time: 11/15/2020 2:10 PM Performed by: Jonna Munro, CRNA Pre-anesthesia Checklist: Patient identified, Emergency Drugs available, Suction available, Patient being monitored and Timeout performed Patient Re-evaluated:Patient Re-evaluated prior to induction Oxygen Delivery Method: Circle system utilized Preoxygenation: Pre-oxygenation with 100% oxygen Induction Type: IV induction Ventilation: Mask ventilation without difficulty Laryngoscope Size: Mac and 3 Grade View: Grade I Tube type: Oral Tube size: 7.0 mm Number of attempts: 1 Airway Equipment and Method: Stylet Placement Confirmation: positive ETCO2, ETT inserted through vocal cords under direct vision and breath sounds checked- equal and bilateral Secured at: 23 cm Tube secured with: Tape Dental Injury: Teeth and Oropharynx as per pre-operative assessment

## 2020-11-15 NOTE — Anesthesia Postprocedure Evaluation (Signed)
Anesthesia Post Note  Patient: Jenna Mendoza  Procedure(s) Performed: LAPAROSCOPIC CHOLECYSTECTOMY POSSIBLE OPEN (Abdomen)  Patient location during evaluation: Phase II Anesthesia Type: General Level of consciousness: awake Pain management: pain level controlled Vital Signs Assessment: post-procedure vital signs reviewed and stable Respiratory status: spontaneous breathing and respiratory function stable Cardiovascular status: blood pressure returned to baseline and stable Postop Assessment: no headache and no apparent nausea or vomiting Anesthetic complications: no Comments: Late entry   No notable events documented.   Last Vitals:  Vitals:   11/15/20 1235  BP: 111/78  Pulse: 63  Resp: 18  Temp: 36.9 C  SpO2: 100%    Last Pain:  Vitals:   11/15/20 1506  TempSrc:   PainSc: [place]

## 2020-11-15 NOTE — Anesthesia Preprocedure Evaluation (Signed)
Anesthesia Evaluation  Patient identified by MRN, date of birth, ID band Patient awake    Reviewed: Allergy & Precautions, H&P , NPO status , Patient's Chart, lab work & pertinent test results, reviewed documented beta blocker date and time   Airway Mallampati: II  TM Distance: >3 FB Neck ROM: full    Dental no notable dental hx.    Pulmonary neg pulmonary ROS,    Pulmonary exam normal breath sounds clear to auscultation       Cardiovascular Exercise Tolerance: Good negative cardio ROS   Rhythm:regular Rate:Normal     Neuro/Psych negative neurological ROS  negative psych ROS   GI/Hepatic negative GI ROS, Neg liver ROS,   Endo/Other  negative endocrine ROS  Renal/GU negative Renal ROS  negative genitourinary   Musculoskeletal   Abdominal   Peds  Hematology negative hematology ROS (+)   Anesthesia Other Findings   Reproductive/Obstetrics negative OB ROS                             Anesthesia Physical Anesthesia Plan  ASA: 2  Anesthesia Plan: General and General ETT   Post-op Pain Management:    Induction:   PONV Risk Score and Plan: Ondansetron  Airway Management Planned:   Additional Equipment:   Intra-op Plan:   Post-operative Plan:   Informed Consent: I have reviewed the patients History and Physical, chart, labs and discussed the procedure including the risks, benefits and alternatives for the proposed anesthesia with the patient or authorized representative who has indicated his/her understanding and acceptance.     Dental Advisory Given  Plan Discussed with: CRNA  Anesthesia Plan Comments:         Anesthesia Quick Evaluation  

## 2020-11-15 NOTE — Transfer of Care (Signed)
Immediate Anesthesia Transfer of Care Note  Patient: Jenna Mendoza  Procedure(s) Performed: LAPAROSCOPIC CHOLECYSTECTOMY POSSIBLE OPEN (Abdomen)  Patient Location: PACU  Anesthesia Type:General  Level of Consciousness: awake and patient cooperative  Airway & Oxygen Therapy: Patient Spontanous Breathing  Post-op Assessment: Report given to RN and Post -op Vital signs reviewed and stable  Post vital signs: Reviewed and stable  Last Vitals:  Vitals Value Taken Time  BP 130/91 11/15/20 1505  Temp 98.1   Pulse 86 11/15/20 1505  Resp 8 11/15/20 1505  SpO2 100 % 11/15/20 1505  Vitals shown include unvalidated device data.  Last Pain:  Vitals:   11/15/20 1235  TempSrc: Oral  PainSc: 0-No pain         Complications: No notable events documented.

## 2020-11-15 NOTE — Op Note (Signed)
Operative Note   Preoperative Diagnosis: Symptomatic cholelithiasis, sludge    Postoperative Diagnosis: Same   Procedure(s) Performed: Laparoscopic cholecystectomy   Surgeon: Ria Comment C. Constance Haw, MD   Assistants: No qualified resident was available   Anesthesia: General endotracheal   Anesthesiologist: Louann Sjogren, MD    Specimens: Gallbladder    Estimated Blood Loss: Minimal    Blood Replacement: None    Complications: None    Operative Findings: Distended, long gallbladder    Procedure: The patient was taken to the operating room and placed supine. General endotracheal anesthesia was induced. Intravenous antibiotics were administered per protocol. An orogastric tube positioned to decompress the stomach. The abdomen was prepared and draped in the usual sterile fashion.    A supraumbilical incision was made and a Veress technique was utilized to achieve pneumoperitoneum to 15 mmHg with carbon dioxide. A 11 mm optiview port was placed through the supraumbilical region, and a 10 mm 0-degree operative laparoscope was introduced. The area underlying the trocar and Veress needle were inspected and without evidence of injury.  Remaining trocars were placed under direct vision. Two 5 mm ports were placed in the right abdomen, between the anterior axillary and midclavicular line.  A final 11 mm port was placed through the mid-epigastrium, near the falciform ligament.    The gallbladder fundus was elevated cephalad and the infundibulum was retracted to the patient's right. The gallbladder/cystic duct junction was skeletonized. The cystic artery noted in the triangle of Calot and was also skeletonized.  We then continued liberal medial and lateral dissection until the critical view of safety was achieved.    There was a small <66mm vein crossing beside the cystic duct. This was doubly clipped and divided. The cystic duct and cystic artery were triply clipped and divided. A posterior branch of  the artery was clipped and cauterized. The gallbladder was then dissected from the liver bed with electrocautery. The specimen was placed in an Endopouch and was retrieved through the epigastric site.   Final inspection revealed acceptable hemostasis. Surgical SNOW was placed in the gallbladder bed.  Trocars were removed and pneumoperitoneum was released.  0 Vicryl fascial sutures were used to close the epigastric and umbilical port sites. Skin incisions were closed with 4-0 Monocryl subcuticular sutures and Dermabond. The patient was awakened from anesthesia and extubated without complication.    Curlene Labrum, MD [facility] [address] [place], [place] [address] [phone] (office)

## 2020-11-16 ENCOUNTER — Encounter (HOSPITAL_COMMUNITY): Payer: Self-pay | Admitting: General Surgery

## 2020-11-17 LAB — SURGICAL PATHOLOGY

## 2020-11-18 ENCOUNTER — Encounter (HOSPITAL_COMMUNITY): Payer: Self-pay | Admitting: *Deleted

## 2020-11-18 ENCOUNTER — Encounter (HOSPITAL_COMMUNITY): Payer: BC Managed Care – PPO

## 2020-11-18 ENCOUNTER — Emergency Department (HOSPITAL_COMMUNITY)
Admission: EM | Admit: 2020-11-18 | Discharge: 2020-11-18 | Disposition: A | Discharge status: Home / Self Care | Payer: BC Managed Care – PPO | Attending: Emergency Medicine | Admitting: Emergency Medicine

## 2020-11-18 ENCOUNTER — Other Ambulatory Visit: Payer: Self-pay

## 2020-11-18 ENCOUNTER — Emergency Department (HOSPITAL_COMMUNITY): Payer: BC Managed Care – PPO

## 2020-11-18 DIAGNOSIS — R1013 Epigastric pain: Secondary | ICD-10-CM

## 2020-11-18 DIAGNOSIS — R1084 Generalized abdominal pain: Secondary | ICD-10-CM | POA: Insufficient documentation

## 2020-11-18 DIAGNOSIS — R109 Unspecified abdominal pain: Secondary | ICD-10-CM | POA: Diagnosis present

## 2020-11-18 LAB — CBC WITH DIFFERENTIAL/PLATELET
Abs Immature Granulocytes: 0.02 10*3/uL (ref 0.00–0.07)
Basophils Absolute: 0 10*3/uL (ref 0.0–0.1)
Basophils Relative: 0 %
Eosinophils Absolute: 0 10*3/uL (ref 0.0–0.5)
Eosinophils Relative: 0 %
HCT: 40.1 % (ref 36.0–46.0)
Hemoglobin: 13.2 g/dL (ref 12.0–15.0)
Immature Granulocytes: 0 %
Lymphocytes Relative: 20 %
Lymphs Abs: 1.4 10*3/uL (ref 0.7–4.0)
MCH: 30.1 pg (ref 26.0–34.0)
MCHC: 32.9 g/dL (ref 30.0–36.0)
MCV: 91.6 fL (ref 80.0–100.0)
Monocytes Absolute: 0.5 10*3/uL (ref 0.1–1.0)
Monocytes Relative: 8 %
Neutro Abs: 5 10*3/uL (ref 1.7–7.7)
Neutrophils Relative %: 72 %
Platelets: 205 10*3/uL (ref 150–400)
RBC: 4.38 MIL/uL (ref 3.87–5.11)
RDW: 12.5 % (ref 11.5–15.5)
WBC: 6.9 10*3/uL (ref 4.0–10.5)
nRBC: 0 % (ref 0.0–0.2)

## 2020-11-18 LAB — COMPREHENSIVE METABOLIC PANEL
ALT: 57 U/L — ABNORMAL HIGH (ref 0–44)
AST: 16 U/L (ref 15–41)
Albumin: 3.9 g/dL (ref 3.5–5.0)
Alkaline Phosphatase: 61 U/L (ref 38–126)
Anion gap: 8 (ref 5–15)
BUN: 8 mg/dL (ref 6–20)
CO2: 26 mmol/L (ref 22–32)
Calcium: 8.5 mg/dL — ABNORMAL LOW (ref 8.9–10.3)
Chloride: 103 mmol/L (ref 98–111)
Creatinine, Ser: 0.49 mg/dL (ref 0.44–1.00)
GFR, Estimated: >60 mL/min (ref >60)
Glucose, Bld: 98 mg/dL (ref 70–99)
Potassium: 3.5 mmol/L (ref 3.5–5.1)
Sodium: 137 mmol/L (ref 135–145)
Total Bilirubin: 0.7 mg/dL (ref 0.3–1.2)
Total Protein: 7.1 g/dL (ref 6.5–8.1)

## 2020-11-18 LAB — I-STAT BETA HCG BLOOD, ED (MC, WL, AP ONLY): I-stat hCG, quantitative: <5 m[IU]/mL (ref <5)

## 2020-11-18 LAB — LIPASE, BLOOD: Lipase: 21 U/L (ref 11–51)

## 2020-11-18 MED ORDER — PROMETHAZINE HCL 25 MG PO TABS: 25.0000 mg | ORAL_TABLET | Freq: Four times a day (QID) | ORAL | 0 refills | Status: DC | PRN

## 2020-11-18 MED ORDER — HYDROMORPHONE HCL 1 MG/ML IJ SOLN
1.0000 mg | Freq: Once | INTRAMUSCULAR | Status: AC
  Administered 2020-11-18: 1 mg via INTRAVENOUS
  Filled 2020-11-18: qty 1

## 2020-11-18 MED ORDER — OXYCODONE-ACETAMINOPHEN 5-325 MG PO TABS: 1.0000 | ORAL_TABLET | Freq: Four times a day (QID) | ORAL | 0 refills | Status: DC | PRN

## 2020-11-18 MED ORDER — ONDANSETRON HCL 4 MG/2ML IJ SOLN
4.0000 mg | Freq: Once | INTRAMUSCULAR | Status: AC
  Administered 2020-11-18: 4 mg via INTRAVENOUS
  Filled 2020-11-18: qty 2

## 2020-11-18 MED ORDER — IOHEXOL 300 MG/ML  SOLN
100.0000 mL | Freq: Once | INTRAMUSCULAR | Status: AC | PRN
  Administered 2020-11-18: 100 mL via INTRAVENOUS

## 2020-11-18 MED ORDER — PROMETHAZINE HCL 25 MG/ML IJ SOLN
INTRAMUSCULAR | Status: AC
  Filled 2020-11-18: qty 1

## 2020-11-18 MED ORDER — MORPHINE SULFATE (PF) 2 MG/ML IV SOLN
2.0000 mg | Freq: Once | INTRAVENOUS | Status: AC
  Administered 2020-11-18: 2 mg via INTRAVENOUS
  Filled 2020-11-18: qty 1

## 2020-11-18 MED ORDER — SODIUM CHLORIDE 0.9 % IV SOLN
25.0000 mg | Freq: Four times a day (QID) | INTRAVENOUS | Status: DC | PRN
  Administered 2020-11-18: 25 mg via INTRAVENOUS
  Filled 2020-11-18: qty 1

## 2020-11-18 MED ORDER — SODIUM CHLORIDE 0.9 % IV BOLUS
1000.0000 mL | Freq: Once | INTRAVENOUS | Status: AC
  Administered 2020-11-18: 1000 mL via INTRAVENOUS

## 2020-11-18 MED ORDER — IOHEXOL 9 MG/ML PO SOLN
ORAL | Status: AC
  Administered 2020-11-18: 500 mL
  Filled 2020-11-18: qty 1000

## 2020-11-18 NOTE — ED Triage Notes (Signed)
Surgery 11/01 for gallbladder. Pain in abdominal area today

## 2020-11-18 NOTE — Discharge Instructions (Signed)
Your testing has been reassuring, I would like for you to follow-up with your general surgeon at your appointed time however if you develop severe or worsening symptoms return to the emergency department immediately.  I have given you additional medications for nausea called Phenergan which you can take 1 tablet every 8 hours as needed, I have also given you some additional oxycodone for severe pain.  I would like for you to drink plenty of clear liquids and make sure that you are taking a stool softener to avoid constipation.

## 2020-11-18 NOTE — ED Provider Notes (Signed)
[facility] EMERGENCY DEPARTMENT Provider Note   CSN: [id number] Arrival date & time: 11/18/20  1625     History Chief Complaint  Patient presents with   Abdominal Pain    Jenna Mendoza is a 30 y.o. female who is 3 days status post cholecystectomy who presents the emergency department with diffuse abdominal pain that is acutely gotten worse and constant since this morning.  She states that she was feeling fine the first 48 hours apart from general soreness but this morning woke up in severe pain.  She spoke with her surgeon who instructed her to take some walks as it was likely secondary to trapped gas.  Walking and medications for gas did not improve her pain which prompted her arrival today.  She did report associated nausea and 2 episodes of vomiting.  She denies any fever, chills, diarrhea, urinary complaints, chest pain, shortness of breath.  She rates her abdominal pain moderate in severity.   Abdominal Pain     Past Medical History:  Diagnosis Date   Allergy    shellfish   Degenerative joint disease 2008   Postpartum care following vaginal delivery (11/6) 11/21/2015   Synovial chondromatoses    Urinary tract infection during pregnancy mid december 2012    Patient Active Problem List   Diagnosis Date Noted   Calculus of gallbladder without cholecystitis without obstruction 11/03/2020   Non-reassuring fetal heart rate with late deceleration 08/03/2019    Past Surgical History:  Procedure Laterality Date   CHOLECYSTECTOMY N/A 11/15/2020   Procedure: LAPAROSCOPIC CHOLECYSTECTOMY;  Surgeon: Virl Cagey, MD;  Location: [facility];  Service: General;  Laterality: N/A;   SHOULDER SURGERY Right      OB History     Gravida  5   Para  4   Term  4   Preterm      AB  1   Living  4      SAB  1   IAB      Ectopic      Multiple  0   Live Births  4           Family History  Problem Relation Age of Onset   Diabetes Mellitus II Paternal Grandfather     Hypertension Father    Breast cancer Maternal Grandmother 110   Schizophrenia Mother    Bipolar disorder Mother    Breast cancer Mother 92   Epilepsy Brother    Anesthesia problems Neg Hx    Hypotension Neg Hx    Malignant hyperthermia Neg Hx    Pseudochol deficiency Neg Hx     Social History   Tobacco Use   Smoking status: Never   Smokeless tobacco: Never  Vaping Use   Vaping Use: Never used  Substance Use Topics   Alcohol use: Not Currently    Alcohol/week: 1.0 standard drink    Types: 1 Standard drinks or equivalent per week    Comment: not while preg   Drug use: No    Home Medications Prior to Admission medications   Medication Sig Start Date End Date Taking? Authorizing Provider  acetaminophen (TYLENOL) 500 MG tablet Take 1,000 mg by mouth every 6 (six) hours as needed for mild pain or headache.   Yes [provider]  buPROPion (WELLBUTRIN XL) 300 MG 24 hr tablet Take 1 tablet (300 mg total) by mouth daily. 08/22/20  Yes Dettinger, Fransisca Kaufmann, MD  ondansetron (ZOFRAN) 4 MG tablet Take 1 tablet (4 mg total)  by mouth every 8 (eight) hours as needed. 11/15/20 11/15/21 Yes Virl Cagey, MD  oxyCODONE (ROXICODONE) 5 MG immediate release tablet Take 1 tablet (5 mg total) by mouth every 4 (four) hours as needed. 11/15/20 11/15/21 Yes Virl Cagey, MD  simethicone (MYLICON) 315 MG chewable tablet Chew 125 mg by mouth every 6 (six) hours as needed for flatulence.   Yes [provider]  levonorgestrel (MIRENA, 52 MG,) 20 MCG/24HR IUD 1 Intra Uterine Device by Intrauterine route continuous as needed.    [provider]  Semaglutide,0.25 or 0.5MG /DOS, (OZEMPIC, 0.25 OR 0.5 MG/DOSE,) 2 MG/1.5ML SOPN Inject 0.5 mg into the skin once a week. Patient not taking: Reported on 11/18/2020 10/26/20   Dettinger, Fransisca Kaufmann, MD    Allergies    Shellfish allergy  Review of Systems   Review of Systems  Gastrointestinal:  Positive for abdominal pain.  All other  systems reviewed and are negative.  Physical Exam Updated Vital Signs BP 134/86   Pulse 75   Temp 98.2 F (36.8 C) (Oral)   Resp 20   LMP 10/20/2020 Comment: urine HCG negative 11/15/2020  SpO2 100%   Physical Exam Vitals and nursing note reviewed.  Constitutional:      General: She is not in acute distress.    Appearance: Normal appearance.  HENT:     Head: Normocephalic and atraumatic.  Eyes:     General:        Right eye: No discharge.        Left eye: No discharge.  Cardiovascular:     Comments: Regular rate and rhythm.  S1/S2 are distinct without any evidence of murmur, rubs, or gallops.  Radial pulses are 2+ bilaterally.  Dorsalis pedis pulses are 2+ bilaterally.  No evidence of pedal edema. Pulmonary:     Comments: Clear to auscultation bilaterally.  Normal effort.  No respiratory distress.  No evidence of wheezes, rales, or rhonchi heard throughout. Abdominal:     General: Abdomen is flat. Bowel sounds are normal. There is no distension.     Tenderness: There is no guarding or rebound.     Comments: Generalized moderate abdominal tenderness.  Surgical wounds are well-healing without any signs of erythema or significant drainage.  Abdomen is nondistended.  No obvious peritoneal signs.  Musculoskeletal:        General: Normal range of motion.     Cervical back: Neck supple.  Skin:    General: Skin is warm and dry.     Findings: No rash.  Neurological:     General: No focal deficit present.     Mental Status: She is alert.  Psychiatric:        Mood and Affect: Mood normal.        Behavior: Behavior normal.    ED Results / Procedures / Treatments   Labs (all labs ordered are listed, but only abnormal results are displayed) Labs Reviewed  CBC WITH DIFFERENTIAL/PLATELET  COMPREHENSIVE METABOLIC PANEL  LIPASE, BLOOD    EKG None  Radiology No results found.  Procedures Procedures   Medications Ordered in ED Medications  ondansetron (ZOFRAN) injection 4  mg (has no administration in time range)  morphine 2 MG/ML injection 2 mg (has no administration in time range)    ED Course  I have reviewed the triage vital signs and the nursing notes.  Pertinent labs & imaging results that were available during my care of the patient were reviewed by me and considered in my  medical decision making (see chart for details).    MDM Rules/Calculators/A&P                          Jenna Mendoza is a 30 y.o. female who presents the emergency department for evaluation of abdominal pain.  Given her history and physical exam is concerning for postop complications.  CBC was without any leukocytosis with negative pregnancy.  Rest of her work-up is still pending.  At this point is unclear exactly what the etiology of her abdominal pain at this time. The rest of her care was transferred to my attending Dr. Sabra Heck.   Final Clinical Impression(s) / ED Diagnoses Final diagnoses:  None    Rx / DC Orders ED Discharge Orders     None        Cherrie Gauze 11/18/20 1905    Noemi Chapel, MD 11/18/20 2317

## 2020-11-21 DIAGNOSIS — K8020 Calculus of gallbladder without cholecystitis without obstruction: Secondary | ICD-10-CM | POA: Diagnosis present

## 2020-12-01 ENCOUNTER — Ambulatory Visit (INDEPENDENT_AMBULATORY_CARE_PROVIDER_SITE_OTHER): Payer: BC Managed Care – PPO | Admitting: General Surgery

## 2020-12-01 DIAGNOSIS — K8020 Calculus of gallbladder without cholecystitis without obstruction: Secondary | ICD-10-CM

## 2020-12-01 NOTE — Progress Notes (Signed)
[facility]  I am calling the patient for post operative evaluation. This is not a billable encounter as it is under the [facility] charges for the surgery.  The patient had a laparoscopic cholecystectomy on 11/15/2020. The patient was seen in the ED with negative work up 11/18/2020. She did not answer the phone. Told her to call the office and let us know how she is doing.   Pathology: FINAL MICROSCOPIC DIAGNOSIS:   A. 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   Curlene Labrum, MD [facility] [address] [place], [place] [address] [phone] (office)

## 2020-12-12 ENCOUNTER — Encounter: Payer: Self-pay | Admitting: Family

## 2020-12-12 ENCOUNTER — Ambulatory Visit (INDEPENDENT_AMBULATORY_CARE_PROVIDER_SITE_OTHER): Payer: BC Managed Care – PPO | Admitting: Family

## 2020-12-12 ENCOUNTER — Other Ambulatory Visit: Payer: Self-pay

## 2020-12-12 VITALS — BP 123/78 | HR 102 | Temp 97.9°F | Ht 66.0 in | Wt 143.8 lb

## 2020-12-12 DIAGNOSIS — H66011 Acute suppurative otitis media with spontaneous rupture of ear drum, right ear: Secondary | ICD-10-CM

## 2020-12-12 DIAGNOSIS — R6883 Chills (without fever): Secondary | ICD-10-CM

## 2020-12-12 DIAGNOSIS — J029 Acute pharyngitis, unspecified: Secondary | ICD-10-CM

## 2020-12-12 LAB — RAPID STREP SCREEN (MED CTR MEBANE ONLY): Strep Gp A Ag, IA W/Reflex: NEGATIVE

## 2020-12-12 LAB — CULTURE, GROUP A STREP

## 2020-12-12 LAB — VERITOR FLU A/B WAIVED
Influenza A: NEGATIVE
Influenza B: NEGATIVE

## 2020-12-12 MED ORDER — AMOXICILLIN-POT CLAVULANATE 875-125 MG PO TABS: 1.0000 | ORAL_TABLET | Freq: Two times a day (BID) | ORAL | 0 refills | Status: DC

## 2020-12-12 NOTE — Patient Instructions (Signed)
Pharyngitis °Pharyngitis is inflammation of the throat (pharynx). It is a very common cause of sore throat. Pharyngitis can be caused by a bacteria, but it is usually caused by a virus. Most cases of pharyngitis get better on their own without treatment. °What are the causes? °This condition may be caused by: °Infection by viruses (viral). Viral pharyngitis spreads easily from person to person (is contagious) through coughing, sneezing, and sharing of personal items or utensils such as cups, forks, spoons, and toothbrushes. °Infection by bacteria (bacterial). Bacterial pharyngitis may be spread by touching the nose or face after coming in contact with the bacteria, or through close contact, such as kissing. °Allergies. Allergies can cause buildup of mucus in the throat (post-nasal drip), leading to inflammation and irritation. Allergies can also cause blocked nasal passages, forcing breathing through the mouth, which dries and irritates the throat. °What increases the risk? °You are more likely to develop this condition if: °You are 5-24 years old. °You are exposed to crowded environments such as daycare, school, or dormitory living. °You live in a cold climate. °You have a weakened disease-fighting (immune) system. °What are the signs or symptoms? °Symptoms of this condition vary by the cause. Common symptoms of this condition include: °Sore throat. °Fatigue. °Low-grade fever. °Stuffy nose (nasal congestion) and cough. °Headache. °Other symptoms may include: °Glands in the neck (lymph nodes) that are swollen. °Skin rashes. °Plaque-like film on the throat or tonsils. This is often a symptom of bacterial pharyngitis. °Vomiting. °Red, itchy eyes (conjunctivitis). °Loss of appetite. °Joint pain and muscle aches. °Enlarged tonsils. °How is this diagnosed? °This condition may be diagnosed based on your medical history and a physical exam. Your health care provider will ask you questions about your illness and your  symptoms. °A swab of your throat may be done to check for bacteria (rapid strep test). Other lab tests may also be done, depending on the suspected cause, but these are rare. °How is this treated? °Many times, treatment is not needed for this condition. Pharyngitis usually gets better in 3-4 days without treatment. °Bacterial pharyngitis may be treated with antibiotic medicines. °Follow these instructions at home: °Medicines °Take over-the-counter and prescription medicines only as told by your health care provider. °If you were prescribed an antibiotic medicine, take it as told by your health care provider. Do not stop taking the antibiotic even if you start to feel better. °Use throat sprays to soothe your throat as told by your health care provider. °Children can get pharyngitis. Do not give your child aspirin because of the association with Reye's syndrome. °Managing pain °To help with pain, try: °Sipping warm liquids, such as broth, herbal tea, or warm water. °Eating or drinking cold or frozen liquids, such as frozen ice pops. °Gargling with a mixture of salt and water 3-4 times a day or as needed. To make salt water, completely dissolve ½-1 tsp (3-6 g) of salt in 1 cup (237 mL) of warm water. °Sucking on hard candy or throat lozenges. °Putting a cool-mist humidifier in your bedroom at night to moisten the air. °Sitting in the bathroom with the door closed for 5-10 minutes while you run hot water in the shower. ° °General instructions ° °Do not use any products that contain nicotine or tobacco. These products include cigarettes, chewing tobacco, and vaping devices, such as e-cigarettes. If you need help quitting, ask your health care provider. °Rest as told by your health care provider. °Drink enough fluid to keep your urine pale yellow. °How   is this prevented? °To help prevent becoming infected or spreading infection: °Wash your hands often with soap and water for at least 20 seconds. If soap and water are not  available, use hand sanitizer. °Do not touch your eyes, nose, or mouth with unwashed hands, and wash hands after touching these areas. °Do not share cups or eating utensils. °Avoid close contact with people who are sick. °Contact a health care provider if: °You have large, tender lumps in your neck. °You have a rash. °You cough up green, yellow-brown, or bloody mucus. °Get help right away if: °Your neck becomes stiff. °You drool or are unable to swallow liquids. °You cannot drink or take medicines without vomiting. °You have severe pain that does not go away, even after you take medicine. °You have trouble breathing, and it is not caused by a stuffy nose. °You have new pain and swelling in your joints such as the knees, ankles, wrists, or elbows. °These symptoms may represent a serious problem that is an emergency. Do not wait to see if the symptoms will go away. Get medical help right away. Call your local emergency services (911 in the [place] Do not drive yourself to the hospital. °Summary °Pharyngitis is redness, pain, and swelling (inflammation) of the throat (pharynx). °While pharyngitis can be caused by a bacteria, the most common causes are viral. °Most cases of pharyngitis get better on their own without treatment. °Bacterial pharyngitis is treated with antibiotic medicines. °This information is not intended to replace advice given to you by your health care provider. Make sure you discuss any questions you have with your health care provider. °Document Revised: 03/30/2020 Document Reviewed: 03/30/2020 °Elsevier Patient Education © 2022 [employer] Inc. ° °

## 2020-12-12 NOTE — Progress Notes (Signed)
Subjective:    Patient ID: Jenna Mendoza, female    DOB: 09/15/1990, 30 y.o.   MRN: [id number]  Chief Complaint  Patient presents with   Ear Pain   Sore Throat   Cough   Chills   Generalized Body Aches   PT presents to the office today with flu like symptoms that started Friday.  Sore Throat  This is a new problem. The current episode started in the past 7 days. The problem has been rapidly worsening. The pain is at a severity of 6/10. The pain is moderate. Associated symptoms include congestion, coughing, ear discharge, ear pain, a hoarse voice, a plugged ear sensation, swollen glands and trouble swallowing. Pertinent negatives include no headaches or shortness of breath. She has had no exposure to strep. She has tried acetaminophen and NSAIDs for the symptoms. The treatment provided mild relief.  Cough Associated symptoms include ear pain. Pertinent negatives include no headaches or shortness of breath.     Review of Systems  HENT:  Positive for congestion, ear discharge, ear pain, hoarse voice and trouble swallowing.   Respiratory:  Positive for cough. Negative for shortness of breath.   Neurological:  Negative for headaches.  All other systems reviewed and are negative.     Objective:   Physical Exam Vitals reviewed.  Constitutional:      General: She is not in acute distress.    Appearance: She is well-developed.  HENT:     Head: Normocephalic and atraumatic.     Right Ear: External ear normal. Tympanic membrane is perforated, erythematous and bulging.     Nose: Congestion present.     Mouth/Throat:     Pharynx: Oropharyngeal exudate present.     Tonsils: Tonsillar exudate and tonsillar abscess present. 3+ on the right. 3+ on the left.  Eyes:     Pupils: Pupils are equal, round, and reactive to light.  Neck:     Thyroid: No thyromegaly.  Cardiovascular:     Rate and Rhythm: Normal rate and regular rhythm.     Heart sounds: Normal heart sounds. No murmur  heard. Pulmonary:     Effort: Pulmonary effort is normal. No respiratory distress.     Breath sounds: Normal breath sounds. No wheezing.  Abdominal:     General: Bowel sounds are normal. There is no distension.     Palpations: Abdomen is soft.     Tenderness: There is no abdominal tenderness.  Musculoskeletal:        General: No tenderness. Normal range of motion.     Cervical back: Normal range of motion and neck supple.  Skin:    General: Skin is warm and dry.  Neurological:     Mental Status: She is alert and oriented to person, place, and time.     Cranial Nerves: No cranial nerve deficit.     Deep Tendon Reflexes: Reflexes are normal and symmetric.  Psychiatric:        Behavior: Behavior normal.        Thought Content: Thought content normal.        Judgment: Judgment normal.     BP 123/78   Pulse (!) 102   Temp 97.9 F (36.6 C) (Temporal)   Ht 5\' 6"  (1.676 m)   Wt 143 lb 12.8 oz (65.2 kg)   BMI 23.21 kg/m       Assessment & Plan:  Jenna Mendoza comes in today with chief complaint of Ear Pain, Sore Throat, Cough, Chills,  and Generalized Body Aches   Diagnosis and orders addressed:  1. Chills - Novel Coronavirus, NAA ([facility]) - Rapid Strep Screen ([facility] ONLY) - Veritor Flu A/B Waived  2. Acute pharyngitis, unspecified etiology - Take meds as prescribed - Use a cool mist humidifier  -Use saline nose sprays frequently -Force fluids -For any cough or congestion  Use plain Mucinex- regular strength or max strength is fine -For fever or aces or pains- take tylenol or ibuprofen. -Throat lozenges if help -New toothbrush in 3 days Return to the office if symptoms worsen or do not improve  - amoxicillin-clavulanate (AUGMENTIN) 875-125 MG tablet; Take 1 tablet by mouth 2 (two) times daily.  Dispense: 14 tablet; Refill: 0  3. Non-recurrent acute suppurative otitis media of right ear with spontaneous rupture of tympanic membrane -  amoxicillin-clavulanate (AUGMENTIN) 875-125 MG tablet; Take 1 tablet by mouth 2 (two) times daily.  Dispense: 14 tablet; Refill: 0    Evelina Dun, FNP

## 2020-12-13 ENCOUNTER — Other Ambulatory Visit: Payer: Self-pay | Admitting: Family

## 2020-12-13 ENCOUNTER — Encounter: Payer: Self-pay | Admitting: Family Medicine

## 2020-12-13 LAB — SARS-COV-2, NAA 2 DAY TAT

## 2020-12-13 LAB — NOVEL CORONAVIRUS, NAA: SARS-CoV-2, NAA: NOT DETECTED

## 2020-12-13 MED ORDER — CIPROFLOXACIN-DEXAMETHASONE 0.3-0.1 % OT SUSP: 4.0000 [drp] | Freq: Two times a day (BID) | OTIC | 0 refills | Status: DC

## 2021-02-09 ENCOUNTER — Encounter: Payer: Self-pay | Admitting: Family Medicine

## 2021-02-09 DIAGNOSIS — E663 Overweight: Secondary | ICD-10-CM

## 2021-02-09 MED ORDER — OZEMPIC (0.25 OR 0.5 MG/DOSE) 2 MG/1.5ML ~~LOC~~ SOPN: 0.5000 mg | PEN_INJECTOR | SUBCUTANEOUS | 3 refills | Status: DC

## 2021-03-01 ENCOUNTER — Encounter: Payer: Self-pay | Admitting: *Deleted

## 2021-04-26 ENCOUNTER — Ambulatory Visit: Payer: BC Managed Care – PPO | Admitting: Family Medicine

## 2021-05-01 ENCOUNTER — Encounter: Payer: Self-pay | Admitting: Family Medicine

## 2021-05-01 ENCOUNTER — Ambulatory Visit (INDEPENDENT_AMBULATORY_CARE_PROVIDER_SITE_OTHER): Payer: BC Managed Care – PPO | Admitting: Family Medicine

## 2021-05-01 VITALS — BP 128/88 | HR 67 | Ht 66.0 in | Wt 156.0 lb

## 2021-05-01 DIAGNOSIS — F419 Anxiety disorder, unspecified: Secondary | ICD-10-CM

## 2021-05-01 DIAGNOSIS — E663 Overweight: Secondary | ICD-10-CM

## 2021-05-01 DIAGNOSIS — F32A Depression, unspecified: Secondary | ICD-10-CM

## 2021-05-01 MED ORDER — SERTRALINE HCL 50 MG PO TABS: 50.0000 mg | ORAL_TABLET | Freq: Every day | ORAL | 2 refills | Status: DC

## 2021-05-01 MED ORDER — HYDROXYZINE PAMOATE 25 MG PO CAPS: 25.0000 mg | ORAL_CAPSULE | Freq: Three times a day (TID) | ORAL | 1 refills | Status: DC | PRN

## 2021-05-01 MED ORDER — BUPROPION HCL ER (XL) 300 MG PO TB24: 300.0000 mg | ORAL_TABLET | Freq: Every day | ORAL | 1 refills | Status: DC

## 2021-05-01 NOTE — Progress Notes (Signed)
? ?BP 128/88   Pulse 67   Ht _0  (1.676 m)   Wt 156 lb (70.8 kg)   SpO2 100%   BMI 25.18 kg/m?   ? ?Subjective:  ? ?Patient ID: Jenna Mendoza, female    DOB: 13-Dec-1990, 31 y.o.   MRN: [id number] ? ?HPI: ?Jenna Mendoza is a 31 y.o. female presenting on 05/01/2021 for Medical Management of Chronic Issues, Weight Check (Started back on Ozempic about 2 weeks ago), Anxiety, and Depression (Would like to discuss changing Wellbutrin) ? ? ?HPI ?Anxiety recheck ?Patient is coming in today to discuss anxiety and is currently on Wellbutrin 300 mg. She just feels like she is not doing as well.  She has started stress eating and she will get to where her anxiety builds up, commonly this is in the early morning hours when she is half asleep and half awake and she will start feeling chest tightness and panicky and she will build up and get chest tightness and feel jittery and panicky.  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  She is a Education officer, museum.  She is also just about to finish school and about to take her big Praxis test and that is causing stress.  She feels like she gets very irritable with everybody around her and will tense up. ? ?  05/01/2021  ?  4:01 PM 10/26/2020  ?  1:32 PM 10/26/2020  ?  1:31 PM 08/22/2020  ? 11:19 AM 08/22/2020  ? 11:09 AM  ?Depression screen PHQ 2/9  ?Decreased Interest 1  0  0  ?Down, Depressed, Hopeless 1 0 1  1  ?PHQ - 2 Score 2 0 1  1  ?Altered sleeping 1 0  0   ?Tired, decreased energy _1 ?Change in appetite 1 0  0   ?Feeling bad or failure about yourself  0 0  0   ?Trouble concentrating 0 0  0   ?Moving slowly or fidgety/restless 0 0  0   ?Suicidal thoughts 0 0  0   ?PHQ-9 Score 6 1     ?  ? ?Weight gain ?Patient has been having some weight gain and is feeling like she is overweight.  She had been previously on Ozempic before it had been doing programs to keep her weight down including activity and eating right.  She says she has had a  lot more anxiety recently and that has increased her food intake and she has started to gain weight.  She is up 13 pounds from where she was 6 months ago. ? ?Relevant past medical, surgical, family and social history reviewed and updated as indicated. Interim medical history since our last visit reviewed. ?Allergies and medications reviewed and updated. ? ?Review of Systems  ?Constitutional:  Positive for unexpected weight change. Negative for chills and fever.  ?Eyes:  Negative for visual disturbance.  ?Respiratory:  Negative for chest tightness and shortness of breath.   ?Cardiovascular:  Negative for chest pain and leg swelling.  ?Skin:  Negative for rash.  ?Neurological:  Negative for light-headedness and headaches.  ?Psychiatric/Behavioral:  Positive for decreased concentration and dysphoric mood. Negative for agitation, behavioral problems, self-injury, sleep disturbance and suicidal ideas. The patient is nervous/anxious.   ?All other systems reviewed and are negative. ? ?Per HPI unless specifically indicated above ? ? ?Allergies as of 05/01/2021   ? ?   Reactions  ?  Shellfish Allergy Anaphylaxis, Swelling  ? ?  ? ?  ?Medication List  ?  ? ?  ? Accurate as of May 01, 2021  4:37 PM. If you have any questions, ask your nurse or doctor.  ?  ?  ? ?  ? ?STOP taking these medications   ? ?amoxicillin-clavulanate 875-125 MG tablet ?Commonly known as: AUGMENTIN ?Stopped by: Worthy Rancher, MD ?  ?ciprofloxacin-dexamethasone OTIC suspension ?Commonly known as: CIPRODEX ?Stopped by: Worthy Rancher, MD ?  ? ?  ? ?TAKE these medications   ? ?acetaminophen 500 MG tablet ?Commonly known as: TYLENOL ?Take 1,000 mg by mouth every 6 (six) hours as needed for mild pain or headache. ?  ?buPROPion 300 MG 24 hr tablet ?Commonly known as: WELLBUTRIN XL ?Take 1 tablet (300 mg total) by mouth daily. ?  ?hydrOXYzine 25 MG capsule ?Commonly known as: VISTARIL ?Take 1 capsule (25 mg total) by mouth every 8 (eight) hours as  needed for anxiety. ?Started by: Worthy Rancher, MD ?  ?Mirena (52 MG) 20 MCG/DAY Iud ?Generic drug: levonorgestrel ?1 Intra Uterine Device by Intrauterine route continuous as needed. ?  ?Ozempic (0.25 or 0.5 MG/DOSE) 2 MG/1.5ML Sopn ?Generic drug: Semaglutide(0.25 or 0.5MG/DOS) ?Inject 0.5 mg into the skin once a week. ?  ?sertraline 50 MG tablet ?Commonly known as: ZOLOFT ?Take 1 tablet (50 mg total) by mouth daily. ?Started by: Worthy Rancher, MD ?  ? ?  ? ? ? ?Objective:  ? ?BP 128/88   Pulse 67   Ht _0  (1.676 m)   Wt 156 lb (70.8 kg)   SpO2 100%   BMI 25.18 kg/m?   ?Wt Readings from Last 3 Encounters:  ?05/01/21 156 lb (70.8 kg)  ?12/12/20 143 lb 12.8 oz (65.2 kg)  ?11/14/20 149 lb (67.6 kg)  ?  ?Physical Exam ?Vitals and nursing note reviewed.  ?Constitutional:   ?   General: She is not in acute distress. ?   Appearance: Normal appearance. She is well-developed. She is not diaphoretic.  ?Eyes:  ?   Conjunctiva/sclera: Conjunctivae normal.  ?Cardiovascular:  ?   Rate and Rhythm: Normal rate and regular rhythm.  ?   Heart sounds: Normal heart sounds. No murmur heard. ?Pulmonary:  ?   Effort: Pulmonary effort is normal. No respiratory distress.  ?   Breath sounds: Normal breath sounds. No wheezing.  ?Musculoskeletal:     ?   General: No swelling or tenderness. Normal range of motion.  ?Skin: ?   General: Skin is warm and dry.  ?   Findings: No rash.  ?Neurological:  ?   Mental Status: She is alert and oriented to person, place, and time.  ?   Coordination: Coordination normal.  ?Psychiatric:     ?   Behavior: Behavior normal.  ? ? ? ? ?Assessment & Plan:  ? ?Problem List Items Addressed This Visit   ? ?  ? Other  ? Anxiety and depression - Primary  ? Relevant Medications  ? buPROPion (WELLBUTRIN XL) 300 MG 24 hr tablet  ? sertraline (ZOLOFT) 50 MG tablet  ? hydrOXYzine (VISTARIL) 25 MG capsule  ? Other Relevant Orders  ? CBC with Differential/Platelet  ? CMP14+EGFR  ? Thyroid Panel With TSH   ? ?Other Visit Diagnoses   ? ? Overweight      ? Relevant Orders  ? Thyroid Panel With TSH  ? ?  ?  ?Continue Wellbutrin but will add Zoloft and hydroxyzine as needed,  will see if this helps more with her anxiety and lowering her panic.  Also recommended to see a counselor. ?Follow up plan: ?Return if symptoms worsen or fail to improve, for 3 to 4-week anxiety follow-up. ? ?Counseling provided for all of the vaccine components ?Orders Placed This Encounter  ?Procedures  ? CBC with Differential/Platelet  ? CMP14+EGFR  ? Thyroid Panel With TSH  ? ? ?Caryl Pina, MD ?[place] ?05/01/2021, 4:37 PM ? ? ?  ?

## 2021-05-02 ENCOUNTER — Encounter: Payer: Self-pay | Admitting: Family Medicine

## 2021-05-02 LAB — CMP14+EGFR
ALT: 11 IU/L (ref 0–32)
AST: 13 IU/L (ref 0–40)
Albumin/Globulin Ratio: 1.7 (ref 1.2–2.2)
Albumin: 4.6 g/dL (ref 3.9–5.0)
Alkaline Phosphatase: 62 IU/L (ref 44–121)
BUN/Creatinine Ratio: 17 (ref 9–23)
BUN: 9 mg/dL (ref 6–20)
Bilirubin Total: 0.3 mg/dL (ref 0.0–1.2)
CO2: 24 mmol/L (ref 20–29)
Calcium: 9 mg/dL (ref 8.7–10.2)
Chloride: 106 mmol/L (ref 96–106)
Creatinine, Ser: 0.53 mg/dL — ABNORMAL LOW (ref 0.57–1.00)
Globulin, Total: 2.7 g/dL (ref 1.5–4.5)
Glucose: 79 mg/dL (ref 70–99)
Potassium: 3.8 mmol/L (ref 3.5–5.2)
Sodium: 141 mmol/L (ref 134–144)
Total Protein: 7.3 g/dL (ref 6.0–8.5)
eGFR: 128 mL/min/{1.73_m2} (ref >59)

## 2021-05-02 LAB — CBC WITH DIFFERENTIAL/PLATELET
Basophils Absolute: 0 10*3/uL (ref 0.0–0.2)
Basos: 1 %
EOS (ABSOLUTE): 0.2 10*3/uL (ref 0.0–0.4)
Eos: 2 %
Hematocrit: 40 % (ref 34.0–46.6)
Hemoglobin: 13.9 g/dL (ref 11.1–15.9)
Immature Grans (Abs): 0 10*3/uL (ref 0.0–0.1)
Immature Granulocytes: 0 %
Lymphocytes Absolute: 3.3 10*3/uL — ABNORMAL HIGH (ref 0.7–3.1)
Lymphs: 47 %
MCH: 29.9 pg (ref 26.6–33.0)
MCHC: 34.8 g/dL (ref 31.5–35.7)
MCV: 86 fL (ref 79–97)
Monocytes Absolute: 0.5 10*3/uL (ref 0.1–0.9)
Monocytes: 6 %
Neutrophils Absolute: 3.1 10*3/uL (ref 1.4–7.0)
Neutrophils: 44 %
Platelets: 256 10*3/uL (ref 150–450)
RBC: 4.65 x10E6/uL (ref 3.77–5.28)
RDW: 12.5 % (ref 11.7–15.4)
WBC: 7.1 10*3/uL (ref 3.4–10.8)

## 2021-05-02 LAB — THYROID PANEL WITH TSH
Free Thyroxine Index: 2.5 (ref 1.2–4.9)
T3 Uptake Ratio: 30 % (ref 24–39)
T4, Total: 8.3 ug/dL (ref 4.5–12.0)
TSH: 4.16 u[IU]/mL (ref 0.450–4.500)

## 2021-05-25 ENCOUNTER — Ambulatory Visit (INDEPENDENT_AMBULATORY_CARE_PROVIDER_SITE_OTHER): Payer: BC Managed Care – PPO | Admitting: Family Medicine

## 2021-05-25 ENCOUNTER — Encounter: Payer: Self-pay | Admitting: Family Medicine

## 2021-05-25 DIAGNOSIS — F419 Anxiety disorder, unspecified: Secondary | ICD-10-CM

## 2021-05-25 DIAGNOSIS — F32A Depression, unspecified: Secondary | ICD-10-CM

## 2021-05-25 MED ORDER — SERTRALINE HCL 100 MG PO TABS: 100.0000 mg | ORAL_TABLET | Freq: Every day | ORAL | 2 refills | Status: DC

## 2021-05-25 NOTE — Progress Notes (Signed)
? ?BP 128/86   Pulse 97   Ht '5\' 6"'$  (1.676 m)   Wt 152 lb (68.9 kg)   SpO2 98%   BMI 24.53 kg/m?   ? ?Subjective:  ? ?Patient ID: Jenna Mendoza, female    DOB: 18-Sep-1990, 31 y.o.   MRN: [id number] ? ?HPI: ?Jenna Mendoza is a 31 y.o. female presenting on 05/25/2021 for Medical Management of Chronic Issues, Anxiety, and Depression ? ? ?HPI ?Anxiety depression recheck ?Patient is coming in for anxiety and depression recheck.  She is currently taking Wellbutrin and Zoloft.  She just feels like she is not doing as well as she hoped.  She is having less panic attack and anxiety attacks so that has improved but she still feels down and not having as much motivation.  She just kind of goes through the motions and she is able to go through the motions but she is just not where she would like to be.  Any suicidal ideations or thoughts of hurting self ? ?  05/25/2021  ?  4:16 PM 05/01/2021  ?  4:01 PM 10/26/2020  ?  1:32 PM 10/26/2020  ?  1:31 PM 08/22/2020  ? 11:19 AM  ?Depression screen PHQ 2/9  ?Decreased Interest 1 1  0   ?Down, Depressed, Hopeless 1 1 0 1   ?PHQ - 2 Score 2 2 0 1   ?Altered sleeping 0 1 0  0  ?Tired, decreased energy '1 2 1  1  '$ ?Change in appetite 0 1 0  0  ?Feeling bad or failure about yourself  0 0 0  0  ?Trouble concentrating 0 0 0  0  ?Moving slowly or fidgety/restless 0 0 0  0  ?Suicidal thoughts 0 0 0  0  ?PHQ-9 Score '3 6 1    '$ ?  ? ?Relevant past medical, surgical, family and social history reviewed and updated as indicated. Interim medical history since our last visit reviewed. ?Allergies and medications reviewed and updated. ? ?Review of Systems  ?Constitutional:  Negative for chills and fever.  ?Eyes:  Negative for visual disturbance.  ?Respiratory:  Negative for chest tightness and shortness of breath.   ?Cardiovascular:  Negative for chest pain and leg swelling.  ?Musculoskeletal:  Negative for back pain and gait problem.  ?Skin:  Negative for rash.  ?Neurological:  Negative for  light-headedness and headaches.  ?Psychiatric/Behavioral:  Positive for dysphoric mood. Negative for agitation, behavioral problems, self-injury, sleep disturbance and suicidal ideas. The patient is nervous/anxious.   ?All other systems reviewed and are negative. ? ?Per HPI unless specifically indicated above ? ? ?Allergies as of 05/25/2021   ? ?   Reactions  ? Shellfish Allergy Anaphylaxis, Swelling  ? ?  ? ?  ?Medication List  ?  ? ?  ? Accurate as of May 25, 2021  4:49 PM. If you have any questions, ask your nurse or doctor.  ?  ?  ? ?  ? ?acetaminophen 500 MG tablet ?Commonly known as: TYLENOL ?Take 1,000 mg by mouth every 6 (six) hours as needed for mild pain or headache. ?  ?buPROPion 300 MG 24 hr tablet ?Commonly known as: WELLBUTRIN XL ?Take 1 tablet (300 mg total) by mouth daily. ?  ?hydrOXYzine 25 MG capsule ?Commonly known as: VISTARIL ?Take 1 capsule (25 mg total) by mouth every 8 (eight) hours as needed for anxiety. ?  ?Mirena (52 MG) 20 MCG/DAY Iud ?Generic drug: levonorgestrel ?1 Intra Uterine Device by Intrauterine route  continuous as needed. ?  ?Ozempic (0.25 or 0.5 MG/DOSE) 2 MG/1.5ML Sopn ?Generic drug: Semaglutide(0.25 or 0.'5MG'$ /DOS) ?Inject 0.5 mg into the skin once a week. ?  ?sertraline 100 MG tablet ?Commonly known as: ZOLOFT ?Take 1 tablet (100 mg total) by mouth daily. ?What changed:  ?medication strength ?how much to take ?Changed by: Worthy Rancher, MD ?  ? ?  ? ? ? ?Objective:  ? ?BP 128/86   Pulse 97   Ht '5\' 6"'$  (1.676 m)   Wt 152 lb (68.9 kg)   SpO2 98%   BMI 24.53 kg/m?   ?Wt Readings from Last 3 Encounters:  ?05/25/21 152 lb (68.9 kg)  ?05/01/21 156 lb (70.8 kg)  ?12/12/20 143 lb 12.8 oz (65.2 kg)  ?  ?Physical Exam ?Vitals and nursing note reviewed.  ?Constitutional:   ?   General: She is not in acute distress. ?   Appearance: She is well-developed. She is not diaphoretic.  ?Eyes:  ?   Conjunctiva/sclera: Conjunctivae normal.  ?Cardiovascular:  ?   Rate and Rhythm: Normal rate  and regular rhythm.  ?   Heart sounds: Normal heart sounds. No murmur heard. ?Pulmonary:  ?   Effort: Pulmonary effort is normal. No respiratory distress.  ?   Breath sounds: Normal breath sounds. No wheezing.  ?Musculoskeletal:     ?   General: No swelling or tenderness. Normal range of motion.  ?Skin: ?   General: Skin is warm and dry.  ?   Findings: No rash.  ?Neurological:  ?   Mental Status: She is alert and oriented to person, place, and time.  ?   Coordination: Coordination normal.  ?Psychiatric:     ?   Mood and Affect: Mood is anxious and depressed.     ?   Behavior: Behavior normal.     ?   Thought Content: Thought content does not include suicidal ideation. Thought content does not include suicidal plan.  ? ? ? ? ?Assessment & Plan:  ? ?Problem List Items Addressed This Visit   ? ?  ? Other  ? Anxiety and depression  ? Relevant Medications  ? sertraline (ZOLOFT) 100 MG tablet  ?Increase Zoloft to 100 mg daily and continue Wellbutrin and follow-up in 3 to 4 weeks to see how she is doing. ? ?Follow up plan: ?Return if symptoms worsen or fail to improve, for 3 to 4 weeks anxiety and depression recheck. ? ?Counseling provided for all of the vaccine components ?No orders of the defined types were placed in this encounter. ? ? ?Caryl Pina, MD ?[place] ?05/25/2021, 4:49 PM ? ? ?  ?

## 2021-06-21 ENCOUNTER — Ambulatory Visit (INDEPENDENT_AMBULATORY_CARE_PROVIDER_SITE_OTHER): Payer: BC Managed Care – PPO | Admitting: Family Medicine

## 2021-06-21 ENCOUNTER — Encounter: Payer: Self-pay | Admitting: Family Medicine

## 2021-06-21 VITALS — BP 135/71 | HR 85 | Temp 97.0°F | Ht 66.0 in | Wt 160.0 lb

## 2021-06-21 DIAGNOSIS — F419 Anxiety disorder, unspecified: Secondary | ICD-10-CM

## 2021-06-21 DIAGNOSIS — F32A Depression, unspecified: Secondary | ICD-10-CM

## 2021-06-21 MED ORDER — DESVENLAFAXINE SUCCINATE ER 50 MG PO TB24: 50.0000 mg | ORAL_TABLET | Freq: Every day | ORAL | 2 refills | Status: DC

## 2021-06-21 NOTE — Progress Notes (Signed)
BP 135/71   Pulse 85   Temp (!) 97 F (36.1 C)   Ht '5\' 6"'$  (1.676 m)   Wt 160 lb (72.6 kg)   SpO2 100%   BMI 25.82 kg/m    Subjective:   Patient ID: Jenna Mendoza, female    DOB: 1990/07/22, 31 y.o.   MRN: [id number]  HPI: Jenna Mendoza is a 31 y.o. female presenting on 06/21/2021 for Medical Management of Chronic Issues, Anxiety, and Depression   HPI Anxiety and depression recheck Patient is currently taking Wellbutrin and Zoloft.  This recently increase the Zoloft to 100 mg last visit.  She says she is not feeling any better.  She says she just still feels anxious and especially at night she has gotten somewhat panicky although it is less frequent.  She says she just does not understand why it is going on because everything is going well for her right now.  She is seeing a counselor as well to help with this.  Denies suicidal ideations or thoughts of hurting self.    06/21/2021    4:18 PM 05/25/2021    4:16 PM 05/01/2021    4:01 PM 10/26/2020    1:32 PM 10/26/2020    1:31 PM  Depression screen PHQ 2/9  Decreased Interest '1 1 1  '$ 0  Down, Depressed, Hopeless '1 1 1 '$ 0 1  PHQ - 2 Score '2 2 2 '$ 0 1  Altered sleeping 2 0 1 0   Tired, decreased energy '2 1 2 1   '$ Change in appetite 1 0 1 0   Feeling bad or failure about yourself  0 0 0 0   Trouble concentrating 0 0 0 0   Moving slowly or fidgety/restless 0 0 0 0   Suicidal thoughts 0 0 0 0   PHQ-9 Score '7 3 6 1   '$ Difficult doing work/chores Not difficult at all         Relevant past medical, surgical, family and social history reviewed and updated as indicated. Interim medical history since our last visit reviewed. Allergies and medications reviewed and updated.  Review of Systems  Constitutional:  Negative for chills and fever.  Eyes:  Negative for visual disturbance.  Respiratory:  Negative for chest tightness and shortness of breath.   Cardiovascular:  Negative for chest pain and leg swelling.  Musculoskeletal:  Negative  for back pain and gait problem.  Skin:  Negative for rash.  Neurological:  Negative for dizziness, light-headedness and headaches.  Psychiatric/Behavioral:  Positive for dysphoric mood. Negative for agitation, behavioral problems, self-injury, sleep disturbance and suicidal ideas. The patient is nervous/anxious.   All other systems reviewed and are negative.  Per HPI unless specifically indicated above   Allergies as of 06/21/2021       Reactions   Shellfish Allergy Anaphylaxis, Swelling        Medication List        Accurate as of June 21, 2021  4:49 PM. If you have any questions, ask your nurse or doctor.          STOP taking these medications    sertraline 100 MG tablet Commonly known as: ZOLOFT Stopped by: Worthy Rancher, MD       TAKE these medications    acetaminophen 500 MG tablet Commonly known as: TYLENOL Take 1,000 mg by mouth every 6 (six) hours as needed for mild pain or headache.   buPROPion 300 MG 24 hr tablet Commonly known as: WELLBUTRIN  XL Take 1 tablet (300 mg total) by mouth daily.   desvenlafaxine 50 MG 24 hr tablet Commonly known as: Pristiq Take 1 tablet (50 mg total) by mouth daily. Started by: Worthy Rancher, MD   hydrOXYzine 25 MG capsule Commonly known as: VISTARIL Take 1 capsule (25 mg total) by mouth every 8 (eight) hours as needed for anxiety.   Mirena (52 MG) 20 MCG/DAY Iud Generic drug: levonorgestrel 1 Intra Uterine Device by Intrauterine route continuous as needed.   Ozempic (0.25 or 0.5 MG/DOSE) 2 MG/1.5ML Sopn Generic drug: Semaglutide(0.25 or 0.'5MG'$ /DOS) Inject 0.5 mg into the skin once a week.         Objective:   BP 135/71   Pulse 85   Temp (!) 97 F (36.1 C)   Ht '5\' 6"'$  (1.676 m)   Wt 160 lb (72.6 kg)   SpO2 100%   BMI 25.82 kg/m   Wt Readings from Last 3 Encounters:  06/21/21 160 lb (72.6 kg)  05/25/21 152 lb (68.9 kg)  05/01/21 156 lb (70.8 kg)    Physical Exam Vitals and nursing note  reviewed.  Constitutional:      General: She is not in acute distress.    Appearance: She is well-developed. She is not diaphoretic.  Eyes:     Conjunctiva/sclera: Conjunctivae normal.  Cardiovascular:     Rate and Rhythm: Normal rate and regular rhythm.     Heart sounds: Normal heart sounds. No murmur heard. Pulmonary:     Effort: Pulmonary effort is normal. No respiratory distress.     Breath sounds: Normal breath sounds. No wheezing.  Musculoskeletal:        General: No swelling. Normal range of motion.  Skin:    General: Skin is warm and dry.     Findings: No rash.  Neurological:     Mental Status: She is alert and oriented to person, place, and time.     Coordination: Coordination normal.  Psychiatric:        Mood and Affect: Mood is anxious and depressed.        Behavior: Behavior normal.        Thought Content: Thought content does not include suicidal ideation. Thought content does not include suicidal plan.      Assessment & Plan:   Problem List Items Addressed This Visit       Other   Anxiety and depression - Primary   Relevant Medications   desvenlafaxine (PRISTIQ) 50 MG 24 hr tablet  Continue Wellbutrin, will taper off of Zoloft and then start Pristiq in a week.  Follow up plan: Return if symptoms worsen or fail to improve, for 6 week f/u.  Counseling provided for all of the vaccine components No orders of the defined types were placed in this encounter.   Caryl Pina, MD [facility] 06/21/2021, 4:49 PM

## 2021-08-02 ENCOUNTER — Ambulatory Visit (INDEPENDENT_AMBULATORY_CARE_PROVIDER_SITE_OTHER): Payer: BC Managed Care – PPO | Admitting: Family Medicine

## 2021-08-02 ENCOUNTER — Encounter: Payer: Self-pay | Admitting: Family Medicine

## 2021-08-02 DIAGNOSIS — F32A Depression, unspecified: Secondary | ICD-10-CM

## 2021-08-02 DIAGNOSIS — F419 Anxiety disorder, unspecified: Secondary | ICD-10-CM | POA: Diagnosis not present

## 2021-08-02 MED ORDER — HYDROXYZINE PAMOATE 25 MG PO CAPS: 25.0000 mg | ORAL_CAPSULE | Freq: Three times a day (TID) | ORAL | 1 refills | Status: DC | PRN

## 2021-08-02 MED ORDER — DESVENLAFAXINE SUCCINATE ER 100 MG PO TB24: 100.0000 mg | ORAL_TABLET | Freq: Every day | ORAL | 1 refills | Status: DC

## 2021-08-02 NOTE — Progress Notes (Signed)
BP 130/81   Pulse (!) 106   Temp 98.1 F (36.7 C)   Ht '5\' 6"'$  (1.676 m)   Wt 163 lb (73.9 kg)   SpO2 96%   BMI 26.31 kg/m    Subjective:   Patient ID: Jenna Mendoza, female    DOB: [birth year], 31 y.o.   MRN: [id number]  HPI: Jenna Mendoza is a 31 y.o. female presenting on 08/02/2021 for Medical Management of Chronic Issues, Anxiety, Depression, and Weight Check (160lb on 6/7)   HPI Anxiety depression recheck Patient is coming today for anxiety and depression follow-up.  She still feels that she has a lot of anxiety and irritability and anger.  She does not feel like the Pristiq 50 mg helped at all.  She is still taking Wellbutrin.  She has also had issues with weight.  Slightly underweight because she is back on the Ozempic because of insurance coverage.    08/02/2021    9:57 AM 06/21/2021    4:18 PM 05/25/2021    4:16 PM 05/01/2021    4:01 PM 10/26/2020    1:32 PM  Depression screen PHQ 2/9  Decreased Interest 0 '1 1 1   '$ Down, Depressed, Hopeless '1 1 1 1 '$ 0  PHQ - 2 Score '1 2 2 2 '$ 0  Altered sleeping 1 2 0 1 0  Tired, decreased energy '1 2 1 2 1  '$ Change in appetite 1 1 0 1 0  Feeling bad or failure about yourself  0 0 0 0 0  Trouble concentrating 0 0 0 0 0  Moving slowly or fidgety/restless 0 0 0 0 0  Suicidal thoughts 0 0 0 0 0  PHQ-9 Score '4 7 3 6 1  '$ Difficult doing work/chores Not difficult at all Not difficult at all        Relevant past medical, surgical, family and social history reviewed and updated as indicated. Interim medical history since our last visit reviewed. Allergies and medications reviewed and updated.  Review of Systems  Constitutional:  Negative for chills and fever.  Eyes:  Negative for visual disturbance.  Respiratory:  Negative for chest tightness and shortness of breath.   Cardiovascular:  Negative for chest pain and leg swelling.  Genitourinary:  Negative for difficulty urinating and dysuria.  Musculoskeletal:  Negative for back pain and gait  problem.  Skin:  Negative for rash.  Neurological:  Negative for dizziness, light-headedness and headaches.  Psychiatric/Behavioral:  Positive for dysphoric mood. Negative for agitation, behavioral problems, self-injury, sleep disturbance and suicidal ideas. The patient is nervous/anxious.   All other systems reviewed and are negative.   Per HPI unless specifically indicated above   Allergies as of 08/02/2021       Reactions   Shellfish Allergy Anaphylaxis, Swelling        Medication List        Accurate as of August 02, 2021 10:47 AM. If you have any questions, ask your nurse or doctor.          acetaminophen 500 MG tablet Commonly known as: TYLENOL Take 1,000 mg by mouth every 6 (six) hours as needed for mild pain or headache.   buPROPion 300 MG 24 hr tablet Commonly known as: WELLBUTRIN XL Take 1 tablet (300 mg total) by mouth daily.   desvenlafaxine 100 MG 24 hr tablet Commonly known as: Pristiq Take 1 tablet (100 mg total) by mouth daily. What changed:  medication strength how much to take Changed by: Fransisca Kaufmann  Annely Sliva, MD   hydrOXYzine 25 MG capsule Commonly known as: VISTARIL Take 1 capsule (25 mg total) by mouth every 8 (eight) hours as needed for anxiety.   Mirena (52 MG) 20 MCG/DAY Iud Generic drug: levonorgestrel 1 Intra Uterine Device by Intrauterine route continuous as needed.   Ozempic (0.25 or 0.5 MG/DOSE) 2 MG/1.5ML Sopn Generic drug: Semaglutide(0.25 or 0.'5MG'$ /DOS) Inject 0.5 mg into the skin once a week.         Objective:   BP 130/81   Pulse (!) 106   Temp 98.1 F (36.7 C)   Ht '5\' 6"'$  (1.676 m)   Wt 163 lb (73.9 kg)   SpO2 96%   BMI 26.31 kg/m   Wt Readings from Last 3 Encounters:  08/02/21 163 lb (73.9 kg)  06/21/21 160 lb (72.6 kg)  05/25/21 152 lb (68.9 kg)    Physical Exam Vitals and nursing note reviewed.  Constitutional:      General: She is not in acute distress.    Appearance: She is well-developed. She is not  diaphoretic.  Eyes:     Conjunctiva/sclera: Conjunctivae normal.  Cardiovascular:     Rate and Rhythm: Normal rate and regular rhythm.     Heart sounds: Normal heart sounds. No murmur heard. Pulmonary:     Effort: Pulmonary effort is normal. No respiratory distress.     Breath sounds: Normal breath sounds. No wheezing.  Skin:    General: Skin is warm and dry.     Findings: No rash.  Neurological:     Mental Status: She is alert and oriented to person, place, and time.     Coordination: Coordination normal.  Psychiatric:        Behavior: Behavior normal.       Assessment & Plan:   Problem List Items Addressed This Visit       Other   Anxiety and depression   Relevant Medications   desvenlafaxine (PRISTIQ) 100 MG 24 hr tablet   hydrOXYzine (VISTARIL) 25 MG capsule    Continue Wellbutrin, will increase Pristiq to 100 mg. Follow up plan: Return if symptoms worsen or fail to improve, for 4 to 5-week anxiety depression.  Counseling provided for all of the vaccine components No orders of the defined types were placed in this encounter.   Caryl Pina, MD [facility] 08/02/2021, 10:47 AM

## 2021-09-06 ENCOUNTER — Ambulatory Visit: Payer: BC Managed Care – PPO | Admitting: Family Medicine

## 2021-10-03 ENCOUNTER — Telehealth: Payer: Self-pay

## 2021-10-03 NOTE — Telephone Encounter (Signed)
Jenna Mendoza (Key: [id number]) Rx #: [id number] Ozempic (0.25 or 0.5 MG/DOSE) '2MG'$ /3ML pen-injectors   Form Charity fundraiser PA Form (2017 NCPDP) Created 1 day ago Sent to Plan 6 hours ago Plan Response 6 hours ago Submit Clinical Questions 6 hours ago Determination Unfavorable 5 hours ago Message from [employer] Your PA request has been denied. Additional information will be provided in the denial communication. (Message 1140)

## 2021-10-03 NOTE — Telephone Encounter (Signed)
(  Key: [id number])  Your information has been submitted to [place]. To check for an updated outcome later, reopen this PA request from your dashboard.  If [employer] has not responded to your request within 24 hours, contact [place] at [phone]. If you think there may be a problem with your PA request, use our live chat feature at the bottom right.  Dx. J51.8- Overweight

## 2021-10-04 ENCOUNTER — Encounter: Payer: Self-pay | Admitting: Family Medicine

## 2021-10-04 NOTE — Telephone Encounter (Signed)
Lmtcb.

## 2021-10-04 NOTE — Telephone Encounter (Signed)
Please let the patient know that it looks like Ozempic was denied by her insurance, we could discuss other options that are similar such as [place] or Saxenda but I would recommend for her to call her insurance first and see if either of those are covered

## 2021-10-05 ENCOUNTER — Ambulatory Visit: Payer: BC Managed Care – PPO | Admitting: Family Medicine

## 2021-10-10 ENCOUNTER — Other Ambulatory Visit: Payer: Self-pay | Admitting: Family Medicine

## 2021-10-10 DIAGNOSIS — F419 Anxiety disorder, unspecified: Secondary | ICD-10-CM

## 2021-10-10 DIAGNOSIS — F32A Depression, unspecified: Secondary | ICD-10-CM

## 2021-10-20 ENCOUNTER — Ambulatory Visit
Admission: RE | Admit: 2021-10-20 | Discharge: 2021-10-20 | Disposition: A | Discharge status: Home / Self Care | Source: Ambulatory Visit | Attending: Family Medicine | Admitting: Family Medicine

## 2021-10-20 VITALS — BP 137/91 | HR 120 | Temp 98.7°F | Resp 16

## 2021-10-20 DIAGNOSIS — J039 Acute tonsillitis, unspecified: Secondary | ICD-10-CM | POA: Diagnosis not present

## 2021-10-20 LAB — POCT RAPID STREP A (OFFICE): Rapid Strep A Screen: NEGATIVE

## 2021-10-20 MED ORDER — AMOXICILLIN 875 MG PO TABS: 875.0000 mg | ORAL_TABLET | Freq: Two times a day (BID) | ORAL | 0 refills | Status: DC

## 2021-10-20 NOTE — ED Provider Notes (Signed)
[facility]    CSN: [id number] Arrival date & time: 10/20/21  1344      History   Chief Complaint Chief Complaint  Patient presents with   Sore Throat    Sore throat, throwing up, fever. - Entered by patient    HPI Jenna Mendoza is a 31 y.o. female.   Presenting today with 1 day history of sore, significantly swollen throat, fever, vomiting with trying to eat, muffled voice, body aches, fatigue.  Denies cough, congestion, chest pain, shortness of breath, abdominal pain, nausea vomiting or diarrhea.  So far trying Tylenol with minimal relief.  No known sick contacts recently.    Past Medical History:  Diagnosis Date   Allergy    shellfish   Degenerative joint disease 2008   Postpartum care following vaginal delivery (11/6) 11/21/2015   Synovial chondromatoses    Urinary tract infection during pregnancy mid december 2012    Patient Active Problem List   Diagnosis Date Noted   Anxiety and depression 05/01/2021   Calculus of gallbladder without cholecystitis without obstruction 11/03/2020   Non-reassuring fetal heart rate with late deceleration 08/03/2019    Past Surgical History:  Procedure Laterality Date   CHOLECYSTECTOMY N/A 11/15/2020   Procedure: LAPAROSCOPIC CHOLECYSTECTOMY;  Surgeon: Virl Cagey, MD;  Location: [facility];  Service: General;  Laterality: N/A;   SHOULDER SURGERY Right     OB History     Gravida  5   Para  4   Term  4   Preterm      AB  1   Living  4      SAB  1   IAB      Ectopic      Multiple  0   Live Births  4            Home Medications    Prior to Admission medications   Medication Sig Start Date End Date Taking? Authorizing Provider  amoxicillin (AMOXIL) 875 MG tablet Take 1 tablet (875 mg total) by mouth 2 (two) times daily. 10/20/21  Yes Volney American, PA-C  acetaminophen (TYLENOL) 500 MG tablet Take 1,000 mg by mouth every 6 (six) hours as needed for mild pain or headache.     [provider]  buPROPion (WELLBUTRIN XL) 300 MG 24 hr tablet Take 1 tablet (300 mg total) by mouth daily. 05/01/21   Dettinger, Fransisca Kaufmann, MD  desvenlafaxine (PRISTIQ) 100 MG 24 hr tablet TAKE 1 TABLET(100 MG) BY MOUTH DAILY 10/10/21   Dettinger, Fransisca Kaufmann, MD  hydrOXYzine (VISTARIL) 25 MG capsule Take 1 capsule (25 mg total) by mouth every 8 (eight) hours as needed for anxiety. 08/02/21   Dettinger, Fransisca Kaufmann, MD  levonorgestrel (MIRENA, 52 MG,) 20 MCG/24HR IUD 1 Intra Uterine Device by Intrauterine route continuous as needed.    [provider]  Semaglutide,0.25 or 0.'5MG'$ /DOS, (OZEMPIC, 0.25 OR 0.5 MG/DOSE,) 2 MG/1.5ML SOPN Inject 0.5 mg into the skin once a week. 02/09/21   Dettinger, Fransisca Kaufmann, MD    Family History Family History  Problem Relation Age of Onset   Diabetes Mellitus II Paternal Grandfather    Hypertension Father    Breast cancer Maternal Grandmother 3   Schizophrenia Mother    Bipolar disorder Mother    Breast cancer Mother 101   Epilepsy Brother    Anesthesia problems Neg Hx    Hypotension Neg Hx    Malignant hyperthermia Neg Hx    Pseudochol deficiency Neg  Hx     Social History Social History   Tobacco Use   Smoking status: Never   Smokeless tobacco: Never  Vaping Use   Vaping Use: Never used  Substance Use Topics   Alcohol use: Not Currently    Alcohol/week: 1.0 standard drink of alcohol    Types: 1 Standard drinks or equivalent per week    Comment: not while preg   Drug use: No     Allergies   Shellfish allergy   Review of Systems Review of Systems Per HPI  Physical Exam Triage Vital Signs ED Triage Vitals  Enc Vitals Group     BP 10/20/21 1352 (!) 137/91     Pulse Rate 10/20/21 1352 (!) 120     Resp 10/20/21 1352 16     Temp 10/20/21 1352 98.7 F (37.1 C)     Temp src --      SpO2 10/20/21 1352 97 %     Weight --      Height --      Head Circumference --      Peak Flow --      Pain Score 10/20/21 1354 7     Pain Loc --       Pain Edu? --      Excl. in [place]? --    No data found.  Updated Vital Signs BP (!) 137/91 (BP Location: Right Arm)   Pulse (!) 120   Temp 98.7 F (37.1 C)   Resp 16   SpO2 97%   Visual Acuity Right Eye Distance:   Left Eye Distance:   Bilateral Distance:    Right Eye Near:   Left Eye Near:    Bilateral Near:     Physical Exam Vitals and nursing note reviewed.  Constitutional:      Appearance: Normal appearance. She is not ill-appearing.  HENT:     Head: Atraumatic.     Right Ear: Tympanic membrane normal.     Left Ear: Tympanic membrane normal.     Nose: Nose normal.     Mouth/Throat:     Mouth: Mucous membranes are moist.     Pharynx: Oropharyngeal exudate and posterior oropharyngeal erythema present.     Comments: Moderate tonsillar erythema, edema.  Uvula midline, oral airway patent Eyes:     Extraocular Movements: Extraocular movements intact.     Conjunctiva/sclera: Conjunctivae normal.  Cardiovascular:     Rate and Rhythm: Normal rate and regular rhythm.     Heart sounds: Normal heart sounds.  Pulmonary:     Effort: Pulmonary effort is normal.     Breath sounds: Normal breath sounds.  Musculoskeletal:        General: Normal range of motion.     Cervical back: Normal range of motion and neck supple.  Skin:    General: Skin is warm and dry.  Neurological:     Mental Status: She is alert and oriented to person, place, and time.  Psychiatric:        Mood and Affect: Mood normal.        Thought Content: Thought content normal.        Judgment: Judgment normal.      UC Treatments / Results  Labs (all labs ordered are listed, but only abnormal results are displayed) Labs Reviewed  POCT RAPID STREP A (OFFICE)    EKG   Radiology No results found.  Procedures Procedures (including critical care time)  Medications Ordered in UC Medications - No  data to display  Initial Impression / Assessment and Plan / UC Course  I have reviewed the triage  vital signs and the nursing notes.  Pertinent labs & imaging results that were available during my care of the patient were reviewed by me and considered in my medical decision making (see chart for details).     Tachycardic in triage, otherwise vital signs reassuring.  Exam findings consistent with tonsillitis.  Rapid strep negative but given consistent symptoms and exam, treat with amoxicillin, over-the-counter supportive medications and home care.  Return for worsening symptoms.  Final Clinical Impressions(s) / UC Diagnoses   Final diagnoses:  Acute tonsillitis, unspecified etiology   Discharge Instructions   None    ED Prescriptions     Medication Sig Dispense Auth. Provider   amoxicillin (AMOXIL) 875 MG tablet Take 1 tablet (875 mg total) by mouth 2 (two) times daily. 20 tablet Volney American, [place]      PDMP not reviewed this encounter.   Volney American, [place] 10/20/21 1504

## 2021-10-20 NOTE — ED Triage Notes (Signed)
Pt reports it is hard to talk. Had a fever yesterday. Vomits every time she eats Took tylenol gives some relief.

## 2021-10-24 NOTE — Telephone Encounter (Signed)
Left message for pt to return call.  Pt has an appt on 10/25 with Dettinger.

## 2021-10-26 ENCOUNTER — Other Ambulatory Visit: Payer: Self-pay | Admitting: Family Medicine

## 2021-10-26 DIAGNOSIS — F419 Anxiety disorder, unspecified: Secondary | ICD-10-CM

## 2021-11-08 ENCOUNTER — Encounter: Payer: Self-pay | Admitting: Family Medicine

## 2021-11-08 ENCOUNTER — Ambulatory Visit (INDEPENDENT_AMBULATORY_CARE_PROVIDER_SITE_OTHER): Payer: BC Managed Care – PPO | Admitting: Family Medicine

## 2021-11-08 VITALS — BP 135/88 | HR 98 | Temp 97.8°F | Ht 66.0 in | Wt 166.0 lb

## 2021-11-08 DIAGNOSIS — E663 Overweight: Secondary | ICD-10-CM | POA: Diagnosis not present

## 2021-11-08 DIAGNOSIS — J029 Acute pharyngitis, unspecified: Secondary | ICD-10-CM | POA: Diagnosis not present

## 2021-11-08 DIAGNOSIS — Z23 Encounter for immunization: Secondary | ICD-10-CM

## 2021-11-08 DIAGNOSIS — F32A Depression, unspecified: Secondary | ICD-10-CM

## 2021-11-08 DIAGNOSIS — F419 Anxiety disorder, unspecified: Secondary | ICD-10-CM | POA: Diagnosis not present

## 2021-11-08 LAB — RAPID STREP SCREEN (MED CTR MEBANE ONLY): Strep Gp A Ag, IA W/Reflex: POSITIVE — AB

## 2021-11-08 MED ORDER — BUPROPION HCL ER (XL) 300 MG PO TB24: 300.0000 mg | ORAL_TABLET | Freq: Every day | ORAL | 3 refills | Status: DC

## 2021-11-08 MED ORDER — DESVENLAFAXINE SUCCINATE ER 100 MG PO TB24: 100.0000 mg | ORAL_TABLET | Freq: Every day | ORAL | 3 refills | Status: DC

## 2021-11-08 MED ORDER — AMOXICILLIN-POT CLAVULANATE 875-125 MG PO TABS: 1.0000 | ORAL_TABLET | Freq: Two times a day (BID) | ORAL | 0 refills | Status: DC

## 2021-11-08 NOTE — Addendum Note (Signed)
Addended by: Alphonzo Dublin on: 11/08/2021 11:59 AM   Modules accepted: Orders

## 2021-11-08 NOTE — Progress Notes (Signed)
BP 135/88   Pulse 98   Temp 97.8 F (36.6 C)   Ht '5\' 6"'$  (1.676 m)   Wt 166 lb (75.3 kg)   SpO2 100%   BMI 26.79 kg/m    Subjective:   Patient ID: Jenna Mendoza, female    DOB: 08/04/1990, 31 y.o.   MRN: [id number]  HPI: Jenna Mendoza is a 31 y.o. female presenting on 11/08/2021 for Medical Management of Chronic Issues, Anxiety, and Diabetes   HPI Anxiety depression recheck Patient is coming in for anxiety and depression recheck.  She is currently taking Wellbutrin and Pristiq and does feel like they are doing okay for her.  She does have still some irritability and some anxiety sometimes but a lot of that is understandable because she is studying for a major project that is due in November and leaving the job for another job and so that is helping some stress right now.  She feels like she does pretty well at school and with her kids and family at home.    11/08/2021    8:46 AM 08/02/2021    9:57 AM 06/21/2021    4:18 PM 05/25/2021    4:16 PM 05/01/2021    4:01 PM  Depression screen PHQ 2/9  Decreased Interest 1 0 '1 1 1  '$ Down, Depressed, Hopeless '1 1 1 1 1  '$ PHQ - 2 Score '2 1 2 2 2  '$ Altered sleeping '1 1 2 '$ 0 1  Tired, decreased energy '1 1 2 1 2  '$ Change in appetite 0 1 1 0 1  Feeling bad or failure about yourself  0 0 0 0 0  Trouble concentrating 0 0 0 0 0  Moving slowly or fidgety/restless 0 0 0 0 0  Suicidal thoughts 0 0 0 0 0  PHQ-9 Score '4 4 7 3 6  '$ Difficult doing work/chores Not difficult at all Not difficult at all Not difficult at all       Patient was treated for strep couple weeks ago but she still having some sore throat.  She says is much better than it was but she still having some drainage and congestion.  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  Relevant past medical, surgical, family and social history reviewed and updated as indicated. Interim medical history since our last visit reviewed. Allergies and medications reviewed and  updated.  Review of Systems  Constitutional:  Negative for chills and fever.  HENT:  Positive for congestion and sore throat. Negative for rhinorrhea, sinus pressure, sinus pain and sneezing.   Eyes:  Negative for visual disturbance.  Respiratory:  Negative for cough, chest tightness, shortness of breath and wheezing.   Cardiovascular:  Negative for chest pain and leg swelling.  Genitourinary:  Negative for difficulty urinating and dysuria.  Skin:  Negative for rash.  Neurological:  Negative for dizziness, light-headedness and headaches.  Psychiatric/Behavioral:  Positive for dysphoric mood and sleep disturbance. Negative for agitation, behavioral problems, self-injury and suicidal ideas. The patient is nervous/anxious.   All other systems reviewed and are negative.   Per HPI unless specifically indicated above   Allergies as of 11/08/2021       Reactions   Shellfish Allergy Anaphylaxis, Swelling        Medication List        Accurate as of November 08, 2021  9:02 AM. If you have any questions, ask your nurse or doctor.  STOP taking these medications    amoxicillin 875 MG tablet Commonly known as: AMOXIL Stopped by: Fransisca Kaufmann Brecken Dewoody, MD       TAKE these medications    acetaminophen 500 MG tablet Commonly known as: TYLENOL Take 1,000 mg by mouth every 6 (six) hours as needed for mild pain or headache.   amoxicillin-clavulanate 875-125 MG tablet Commonly known as: AUGMENTIN Take 1 tablet by mouth 2 (two) times daily. Started by: Fransisca Kaufmann Danniella Robben, MD   buPROPion 300 MG 24 hr tablet Commonly known as: WELLBUTRIN XL Take 1 tablet (300 mg total) by mouth daily. What changed: See the new instructions. Changed by: Fransisca Kaufmann Miku Udall, MD   desvenlafaxine 100 MG 24 hr tablet Commonly known as: PRISTIQ Take 1 tablet (100 mg total) by mouth daily. What changed: See the new instructions. Changed by: Fransisca Kaufmann Bolton Canupp, MD   hydrOXYzine 25 MG  capsule Commonly known as: VISTARIL Take 1 capsule (25 mg total) by mouth every 8 (eight) hours as needed for anxiety.   Mirena (52 MG) 20 MCG/DAY Iud Generic drug: levonorgestrel 1 Intra Uterine Device by Intrauterine route continuous as needed.   Ozempic (0.25 or 0.5 MG/DOSE) 2 MG/1.5ML Sopn Generic drug: Semaglutide(0.25 or 0.'5MG'$ /DOS) Inject 0.5 mg into the skin once a week.         Objective:   BP 135/88   Pulse 98   Temp 97.8 F (36.6 C)   Ht '5\' 6"'$  (1.676 m)   Wt 166 lb (75.3 kg)   SpO2 100%   BMI 26.79 kg/m   Wt Readings from Last 3 Encounters:  11/08/21 166 lb (75.3 kg)  08/02/21 163 lb (73.9 kg)  06/21/21 160 lb (72.6 kg)    Physical Exam Vitals and nursing note reviewed.  Constitutional:      General: She is not in acute distress.    Appearance: She is well-developed. She is not diaphoretic.  Eyes:     Conjunctiva/sclera: Conjunctivae normal.     Pupils: Pupils are equal, round, and reactive to light.  Cardiovascular:     Rate and Rhythm: Normal rate and regular rhythm.     Heart sounds: Normal heart sounds. No murmur heard. Pulmonary:     Effort: Pulmonary effort is normal. No respiratory distress.     Breath sounds: Normal breath sounds. No wheezing.  Musculoskeletal:        General: No tenderness. Normal range of motion.  Skin:    General: Skin is warm and dry.     Findings: No rash.  Neurological:     Mental Status: She is alert and oriented to person, place, and time.     Coordination: Coordination normal.  Psychiatric:        Behavior: Behavior normal.     Results for orders placed or performed during the hospital encounter of 10/20/21  POCT rapid strep A  Result Value Ref Range   Rapid Strep A Screen Negative Negative    Assessment & Plan:   Problem List Items Addressed This Visit       Other   Anxiety and depression - Primary   Relevant Medications   desvenlafaxine (PRISTIQ) 100 MG 24 hr tablet   buPROPion (WELLBUTRIN XL) 300  MG 24 hr tablet   Other Visit Diagnoses     Overweight       Acute pharyngitis, unspecified etiology       Relevant Medications   amoxicillin-clavulanate (AUGMENTIN) 875-125 MG tablet   Other Relevant Orders  Rapid Strep Screen ([facility] ONLY)       Patient still having a lot of congestion and sore throat after finishing antibiotic for strep, will reswab for strep with erythema in the back of her throat.  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  Patient has positive self for strep, she may have a resistant strain and will do a higher dose of Augmentin to clear.  She feels like anxiety and depression is doing okay and in an okay place.  She does not want to change anything at this point. Follow up plan: Return if symptoms worsen or fail to improve, for 2 to 56-monthrecheck anxiety depression.  Counseling provided for all of the vaccine components Orders Placed This Encounter  Procedures   Rapid Strep Screen ([facility] ONLY)    JCaryl Pina MD [facility] 11/08/2021, 9:02 AM

## 2021-11-15 ENCOUNTER — Other Ambulatory Visit: Payer: Self-pay | Admitting: Family Medicine

## 2021-11-15 DIAGNOSIS — F419 Anxiety disorder, unspecified: Secondary | ICD-10-CM

## 2021-11-22 ENCOUNTER — Encounter: Payer: Self-pay | Admitting: Family Medicine

## 2021-11-22 ENCOUNTER — Telehealth (INDEPENDENT_AMBULATORY_CARE_PROVIDER_SITE_OTHER): Payer: BC Managed Care – PPO | Admitting: Family Medicine

## 2021-11-22 DIAGNOSIS — J020 Streptococcal pharyngitis: Secondary | ICD-10-CM | POA: Diagnosis not present

## 2021-11-22 MED ORDER — CEFDINIR 300 MG PO CAPS: 300.0000 mg | ORAL_CAPSULE | Freq: Two times a day (BID) | ORAL | 0 refills | Status: DC

## 2021-11-22 NOTE — Progress Notes (Signed)
MyChart Video visit  Subjective: RK:YHCWC PCP: Dettinger, Fransisca Kaufmann, MD BJS:EGBTDV Jenna Mendoza is a 31 y.o. female. Patient provides verbal consent for consult held via video.  Due to COVID-19 pandemic this visit was conducted virtually. This visit type was conducted due to national recommendations for restrictions regarding the COVID-19 Pandemic (e.g. social distancing, sheltering in place) in an effort to limit this patient's exposure and mitigate transmission in our community. All issues noted in this document were discussed and addressed.  A physical exam was not performed with this format.   Location of patient: home Location of provider: [facility] Others present for call: none  1. ?  Recurrent strep Patient went to UC with strep 10/5 and she took amoxicillin.  She had recurrent strep testing. She was given Augmentin and just completed it.  She notes that it started happening again. No fever, no congestion or cough.  She reports sore throat.  ROS: Per HPI  Allergies  Allergen Reactions   Shellfish Allergy Anaphylaxis and Swelling   Past Medical History:  Diagnosis Date   Allergy    shellfish   Degenerative joint disease 2008   Postpartum care following vaginal delivery (11/6) 11/21/2015   Synovial chondromatoses    Urinary tract infection during pregnancy mid december 2012    Current Outpatient Medications:    acetaminophen (TYLENOL) 500 MG tablet, Take 1,000 mg by mouth every 6 (six) hours as needed for mild pain or headache., Disp: , Rfl:    amoxicillin-clavulanate (AUGMENTIN) 875-125 MG tablet, Take 1 tablet by mouth 2 (two) times daily., Disp: 20 tablet, Rfl: 0   buPROPion (WELLBUTRIN XL) 300 MG 24 hr tablet, Take 1 tablet (300 mg total) by mouth daily., Disp: 90 tablet, Rfl: 3   desvenlafaxine (PRISTIQ) 100 MG 24 hr tablet, Take 1 tablet (100 mg total) by mouth daily., Disp: 90 tablet, Rfl: 3   hydrOXYzine (VISTARIL) 25 MG capsule, Take 1 capsule (25 mg total) by mouth every 8  (eight) hours as needed for anxiety., Disp: 30 capsule, Rfl: 1   levonorgestrel (MIRENA, 52 MG,) 20 MCG/24HR IUD, 1 Intra Uterine Device by Intrauterine route continuous as needed., Disp: , Rfl:    Semaglutide,0.25 or 0.'5MG'$ /DOS, (OZEMPIC, 0.25 OR 0.5 MG/DOSE,) 2 MG/1.5ML SOPN, Inject 0.5 mg into the skin once a week., Disp: 6 mL, Rfl: 3   Gen: Tired appearing female in no acute distress HEENT: Exam limited by video but oropharynx is erythematous with exudates present.  Her airway is patent. Pulm: Normal work of breathing on room air.  No dyspnea with speech  Assessment/ Plan: 31 y.o. female   Recurrent streptococcal pharyngitis - Plan: cefdinir (OMNICEF) 300 MG capsule  Concerning that she has had recurrent strep throat.  This was an issue as a child as well so I did discuss with her consideration for ENT referral.  She would like to see how this episode goes before making that decision.  I am going to place her on Omnicef instead as I cannot tell if this is a subacute versus recurrent strep throat.  Home care junctions were reviewed with the patient.  Reasons for reevaluation discussed.  Red flag signs and symptoms warranting further evaluation in the ER also discussed.  She voiced good understanding of follow-up as needed  Start time: 8:05a End time: 8:10a  Total time spent on patient care (including video visit/ documentation): 5 minutes  [place], [address] [phone]

## 2021-12-27 ENCOUNTER — Encounter: Payer: Self-pay | Admitting: Family Medicine

## 2022-01-04 ENCOUNTER — Encounter: Payer: Self-pay | Admitting: Family Medicine

## 2022-01-04 ENCOUNTER — Ambulatory Visit (INDEPENDENT_AMBULATORY_CARE_PROVIDER_SITE_OTHER): Payer: BC Managed Care – PPO | Admitting: Family Medicine

## 2022-01-04 VITALS — BP 128/77 | HR 78 | Temp 98.3°F | Ht 66.0 in | Wt 182.0 lb

## 2022-01-04 DIAGNOSIS — E785 Hyperlipidemia, unspecified: Secondary | ICD-10-CM | POA: Insufficient documentation

## 2022-01-04 DIAGNOSIS — E782 Mixed hyperlipidemia: Secondary | ICD-10-CM

## 2022-01-04 DIAGNOSIS — E663 Overweight: Secondary | ICD-10-CM | POA: Diagnosis not present

## 2022-01-04 MED ORDER — SEMAGLUTIDE-WEIGHT MANAGEMENT 1 MG/0.5ML ~~LOC~~ SOAJ: 1.0000 mg | SUBCUTANEOUS | 0 refills | Status: AC

## 2022-01-04 MED ORDER — SEMAGLUTIDE-WEIGHT MANAGEMENT 0.25 MG/0.5ML ~~LOC~~ SOAJ: 0.2500 mg | SUBCUTANEOUS | 0 refills | Status: AC

## 2022-01-04 MED ORDER — SEMAGLUTIDE-WEIGHT MANAGEMENT 1.7 MG/0.75ML ~~LOC~~ SOAJ: 1.7000 mg | SUBCUTANEOUS | 0 refills | Status: DC

## 2022-01-04 MED ORDER — SEMAGLUTIDE-WEIGHT MANAGEMENT 0.5 MG/0.5ML ~~LOC~~ SOAJ: 0.5000 mg | SUBCUTANEOUS | 0 refills | Status: AC

## 2022-01-04 MED ORDER — SEMAGLUTIDE-WEIGHT MANAGEMENT 2.4 MG/0.75ML ~~LOC~~ SOAJ: 2.4000 mg | SUBCUTANEOUS | 0 refills | Status: DC

## 2022-01-04 NOTE — Progress Notes (Signed)
BP 128/77   Pulse 78   Temp 98.3 F (36.8 C)   Ht '5\' 6"'$  (1.676 m)   Wt 182 lb (82.6 kg)   SpO2 98%   BMI 29.38 kg/m    Subjective:   Patient ID: Jenna Mendoza, female    DOB: 08/05/1990, 32 y.o.   MRN: [id number]  HPI: Jenna Mendoza is a 31 y.o. female presenting on 01/04/2022 for Medical Management of Chronic Issues and Obesity   HPI Obesity and weight management Patient is coming in today for obesity and weight management.  She feels like she is been trying and still keeping active and still exercising but she has gained weight.  She does not know if she has increased appetite.  Or what is going on.  She wants to try doing Wegovy.  She was on Ozempic before and it worked really well for her.  She said her weight gain has started to affect her mood when she was doing better finally on the anxiety depression medicine.  Relevant past medical, surgical, family and social history reviewed and updated as indicated. Interim medical history since our last visit reviewed. Allergies and medications reviewed and updated.  Review of Systems  Constitutional:  Negative for chills and fever.  Eyes:  Negative for visual disturbance.  Respiratory:  Negative for chest tightness and shortness of breath.   Cardiovascular:  Negative for chest pain and leg swelling.  Musculoskeletal:  Negative for back pain and gait problem.  Skin:  Negative for rash.  Neurological:  Negative for dizziness, light-headedness and headaches.  Psychiatric/Behavioral:  Positive for dysphoric mood. Negative for agitation, behavioral problems, self-injury, sleep disturbance and suicidal ideas. The patient is nervous/anxious.   All other systems reviewed and are negative.   Per HPI unless specifically indicated above   Allergies as of 01/04/2022       Reactions   Shellfish Allergy Anaphylaxis, Swelling        Medication List        Accurate as of January 04, 2022  4:38 PM. If you have any questions,  ask your nurse or doctor.          STOP taking these medications    cefdinir 300 MG capsule Commonly known as: OMNICEF Stopped by: Fransisca Kaufmann Skylynn Burkley, MD   Ozempic (0.25 or 0.5 MG/DOSE) 2 MG/1.5ML Sopn Generic drug: Semaglutide(0.25 or 0.'5MG'$ /DOS) Stopped by: Fransisca Kaufmann Dawnisha Marquina, MD       TAKE these medications    acetaminophen 500 MG tablet Commonly known as: TYLENOL Take 1,000 mg by mouth every 6 (six) hours as needed for mild pain or headache.   buPROPion 300 MG 24 hr tablet Commonly known as: WELLBUTRIN XL Take 1 tablet (300 mg total) by mouth daily.   desvenlafaxine 100 MG 24 hr tablet Commonly known as: PRISTIQ Take 1 tablet (100 mg total) by mouth daily.   hydrOXYzine 25 MG capsule Commonly known as: VISTARIL Take 1 capsule (25 mg total) by mouth every 8 (eight) hours as needed for anxiety.   Mirena (52 MG) 20 MCG/DAY Iud Generic drug: levonorgestrel 1 Intra Uterine Device by Intrauterine route continuous as needed.   Semaglutide-Weight Management 0.25 MG/0.5ML Soaj Inject 0.25 mg into the skin once a week for 28 days. Started by: Worthy Rancher, MD   Semaglutide-Weight Management 0.5 MG/0.5ML Soaj Inject 0.5 mg into the skin once a week for 28 days. Start taking on: February 02, 2022 Started by: Worthy Rancher, MD  Semaglutide-Weight Management 1 MG/0.5ML Soaj Inject 1 mg into the skin once a week for 28 days. Start taking on: March 03, 2022 Started by: Fransisca Kaufmann Laneshia Pina, MD   Semaglutide-Weight Management 1.7 MG/0.75ML Soaj Inject 1.7 mg into the skin once a week for 28 days. Start taking on: April 01, 2022 Started by: Fransisca Kaufmann Cynthya Yam, MD   Semaglutide-Weight Management 2.4 MG/0.75ML Soaj Inject 2.4 mg into the skin once a week for 28 days. Start taking on: April 30, 2022 Started by: Fransisca Kaufmann Samari Gorby, MD         Objective:   BP 128/77   Pulse 78   Temp 98.3 F (36.8 C)   Ht '5\' 6"'$  (1.676 m)   Wt 182 lb (82.6 kg)    SpO2 98%   BMI 29.38 kg/m   Wt Readings from Last 3 Encounters:  01/04/22 182 lb (82.6 kg)  11/08/21 166 lb (75.3 kg)  08/02/21 163 lb (73.9 kg)    Physical Exam Vitals and nursing note reviewed.  Constitutional:      General: She is not in acute distress.    Appearance: She is well-developed. She is not diaphoretic.  Eyes:     Conjunctiva/sclera: Conjunctivae normal.  Cardiovascular:     Rate and Rhythm: Normal rate and regular rhythm.     Heart sounds: Normal heart sounds. No murmur heard. Pulmonary:     Effort: Pulmonary effort is normal. No respiratory distress.     Breath sounds: Normal breath sounds. No wheezing.  Musculoskeletal:        General: No tenderness.  Skin:    General: Skin is warm and dry.     Findings: No rash.  Neurological:     Mental Status: She is alert and oriented to person, place, and time.     Coordination: Coordination normal.  Psychiatric:        Behavior: Behavior normal.       Assessment & Plan:   Problem List Items Addressed This Visit       Other   Hyperlipidemia   Other Visit Diagnoses     Overweight    -  Primary   Relevant Medications   Semaglutide-Weight Management 0.25 MG/0.5ML SOAJ   Semaglutide-Weight Management 0.5 MG/0.5ML SOAJ (Start on 02/02/2022)   Semaglutide-Weight Management 1 MG/0.5ML SOAJ (Start on 03/03/2022)   Semaglutide-Weight Management 1.7 MG/0.75ML SOAJ (Start on 04/01/2022)   Semaglutide-Weight Management 2.4 MG/0.75ML SOAJ (Start on 04/30/2022)       BMI of 29 with hyperlipidemia Follow up plan: Return in about 3 months (around 04/05/2022), or if symptoms worsen or fail to improve, for Physical exam and weight recheck.  Counseling provided for all of the vaccine components No orders of the defined types were placed in this encounter.   Caryl Pina, MD [facility] 01/04/2022, 4:38 PM

## 2022-01-05 ENCOUNTER — Telehealth: Payer: Self-pay | Admitting: *Deleted

## 2022-01-05 NOTE — Telephone Encounter (Signed)
Prior auth for [facility] submitted to covermymeds..  Prior Authorization APPROVED

## 2022-01-19 ENCOUNTER — Ambulatory Visit: Payer: BC Managed Care – PPO | Admitting: Family Medicine

## 2022-01-30 ENCOUNTER — Other Ambulatory Visit: Payer: Self-pay | Admitting: Family Medicine

## 2022-01-30 DIAGNOSIS — F419 Anxiety disorder, unspecified: Secondary | ICD-10-CM

## 2022-04-19 ENCOUNTER — Ambulatory Visit (INDEPENDENT_AMBULATORY_CARE_PROVIDER_SITE_OTHER): Payer: BC Managed Care – PPO | Admitting: Family Medicine

## 2022-04-19 VITALS — BP 126/81 | HR 97 | Ht 66.0 in | Wt 185.0 lb

## 2022-04-19 DIAGNOSIS — F419 Anxiety disorder, unspecified: Secondary | ICD-10-CM | POA: Diagnosis not present

## 2022-04-19 DIAGNOSIS — Z0001 Encounter for general adult medical examination with abnormal findings: Secondary | ICD-10-CM | POA: Diagnosis not present

## 2022-04-19 DIAGNOSIS — E782 Mixed hyperlipidemia: Secondary | ICD-10-CM | POA: Diagnosis not present

## 2022-04-19 DIAGNOSIS — E663 Overweight: Secondary | ICD-10-CM | POA: Diagnosis not present

## 2022-04-19 DIAGNOSIS — F32A Depression, unspecified: Secondary | ICD-10-CM

## 2022-04-19 DIAGNOSIS — Z0000 Encounter for general adult medical examination without abnormal findings: Secondary | ICD-10-CM

## 2022-04-19 MED ORDER — ONDANSETRON 4 MG PO TBDP: 4.0000 mg | ORAL_TABLET | Freq: Three times a day (TID) | ORAL | 0 refills | Status: DC | PRN

## 2022-04-19 MED ORDER — WEGOVY 1.7 MG/0.75ML ~~LOC~~ SOAJ: 1.7000 mg | SUBCUTANEOUS | 1 refills | Status: DC

## 2022-04-19 NOTE — Progress Notes (Signed)
BP 126/81   Pulse 97   Ht 5\' 6"  (1.676 m)   Wt 185 lb (83.9 kg)   SpO2 97%   BMI 29.86 kg/m    Subjective:   Patient ID: Jenna Mendoza, female    DOB: August 14, 1990, 32 y.o.   MRN: [id number]  HPI: Jenna Mendoza is a 32 y.o. female presenting on 04/19/2022 for Medical Management of Chronic Issues, Anxiety, and Depression   HPI Physical exam Patient denies any chest pain, shortness of breath, headaches or vision issues, abdominal complaints, diarrhea, nausea, vomiting, or joint issues.  She did start [facility] for weight loss and 0.5 but everything else was stock until the 1.7 and when she tried to make the jump from the 0.5-1.7 she got severely nauseous and did not feel well and so went back to the 0.5.  She does still have the 1.7 and will try and call again for the 1 mg dose and see if she can get that 1.  She is doing daily exercises including some days yoga and some days walking, she does the walking at least 3 times per week.  She does say her weights been affecting her emotional psyche but she has been doing okay with the medicines for anxiety.  Anxiety depression recheck Patient is coming in today for anxiety and depression recheck.  She is currently taking Wellbutrin and Pristiq and hydroxyzine as needed.  She is seeing counseling now and feels like that is helping and she is continue with the Pristiq and Wellbutrin and thinks they do well for her.   Relevant past medical, surgical, family and social history reviewed and updated as indicated. Interim medical history since our last visit reviewed. Allergies and medications reviewed and updated.  Review of Systems  Constitutional:  Negative for chills and fever.  HENT:  Negative for congestion, ear discharge and ear pain.   Eyes:  Negative for redness and visual disturbance.  Respiratory:  Negative for chest tightness and shortness of breath.   Cardiovascular:  Negative for chest pain and leg swelling.  Gastrointestinal:   Negative for abdominal pain.  Genitourinary:  Negative for difficulty urinating and dysuria.  Musculoskeletal:  Negative for back pain and gait problem.  Skin:  Negative for rash.  Neurological:  Negative for light-headedness and headaches.  Psychiatric/Behavioral:  Negative for agitation and behavioral problems.   All other systems reviewed and are negative.   Per HPI unless specifically indicated above   Allergies as of 04/19/2022       Reactions   Shellfish Allergy Anaphylaxis, Swelling        Medication List        Accurate as of April 19, 2022  4:07 PM. If you have any questions, ask your nurse or doctor.          acetaminophen 500 MG tablet Commonly known as: TYLENOL Take 1,000 mg by mouth every 6 (six) hours as needed for mild pain or headache.   buPROPion 300 MG 24 hr tablet Commonly known as: WELLBUTRIN XL TAKE 1 TABLET(300 MG) BY MOUTH DAILY   desvenlafaxine 100 MG 24 hr tablet Commonly known as: PRISTIQ Take 1 tablet (100 mg total) by mouth daily.   hydrOXYzine 25 MG capsule Commonly known as: VISTARIL Take 1 capsule (25 mg total) by mouth every 8 (eight) hours as needed for anxiety.   Mirena (52 MG) 20 MCG/DAY Iud Generic drug: levonorgestrel 1 Intra Uterine Device by Intrauterine route continuous as needed.   ondansetron  4 MG disintegrating tablet Commonly known as: ZOFRAN-ODT Take 1 tablet (4 mg total) by mouth every 8 (eight) hours as needed for nausea or vomiting.   Wegovy 1.7 MG/0.75ML Soaj Generic drug: Semaglutide-Weight Management Inject 1.7 mg into the skin once a week. What changed: Another medication with the same name was removed. Continue taking this medication, and follow the directions you see here.         Objective:   BP 126/81   Pulse 97   Ht 5\' 6"  (1.676 m)   Wt 185 lb (83.9 kg)   SpO2 97%   BMI 29.86 kg/m   Wt Readings from Last 3 Encounters:  04/19/22 185 lb (83.9 kg)  01/04/22 182 lb (82.6 kg)  11/08/21 166 lb  (75.3 kg)    Physical Exam Vitals and nursing note reviewed.  Constitutional:      General: She is not in acute distress.    Appearance: She is well-developed. She is not diaphoretic.  Eyes:     Conjunctiva/sclera: Conjunctivae normal.  Cardiovascular:     Rate and Rhythm: Normal rate and regular rhythm.     Heart sounds: Normal heart sounds. No murmur heard. Pulmonary:     Effort: Pulmonary effort is normal. No respiratory distress.     Breath sounds: Normal breath sounds. No wheezing.  Abdominal:     General: Abdomen is flat. Bowel sounds are normal. There is no distension.     Tenderness: There is no abdominal tenderness. There is no guarding or rebound.  Musculoskeletal:        General: No swelling or tenderness. Normal range of motion.  Skin:    General: Skin is warm and dry.     Findings: No rash.  Neurological:     Mental Status: She is alert and oriented to person, place, and time.     Coordination: Coordination normal.  Psychiatric:        Behavior: Behavior normal.       Assessment & Plan:   Problem List Items Addressed This Visit       Other   Anxiety and depression   Relevant Orders   CBC with Differential/Platelet   CMP14+EGFR   Lipid panel   TSH   Hyperlipidemia   Relevant Orders   CBC with Differential/Platelet   CMP14+EGFR   Lipid panel   TSH   Other Visit Diagnoses     Physical exam    -  Primary   Relevant Orders   CBC with Differential/Platelet   CMP14+EGFR   Lipid panel   TSH   Overweight       Relevant Medications   Semaglutide-Weight Management (WEGOVY) 1.7 MG/0.75ML SOAJ       She will try and call and get the 1 mg dose, if not she will try the 1.7 again and do Zofran to help with nausea.  She will continue doing exercise and watching diet and she is doing a dietary plan and try and exercise more times per week.  She actually went up 3 pounds, the holidays were in there as well Follow up plan: Return in about 3 months  (around 07/19/2022), or if symptoms worsen or fail to improve, for Anxiety and depression and weight recheck.  Counseling provided for all of the vaccine components Orders Placed This Encounter  Procedures   CBC with Differential/Platelet   CMP14+EGFR   Lipid panel   TSH    Caryl Pina, MD [facility] 04/19/2022, 4:07 PM

## 2022-04-20 LAB — CMP14+EGFR
ALT: 12 IU/L (ref 0–32)
AST: 13 IU/L (ref 0–40)
Albumin/Globulin Ratio: 1.6 (ref 1.2–2.2)
Albumin: 4.5 g/dL (ref 3.9–4.9)
Alkaline Phosphatase: 63 IU/L (ref 44–121)
BUN/Creatinine Ratio: 12 (ref 9–23)
BUN: 8 mg/dL (ref 6–20)
Bilirubin Total: <0.2 mg/dL (ref 0.0–1.2)
CO2: 24 mmol/L (ref 20–29)
Calcium: 9 mg/dL (ref 8.7–10.2)
Chloride: 104 mmol/L (ref 96–106)
Creatinine, Ser: 0.67 mg/dL (ref 0.57–1.00)
Globulin, Total: 2.8 g/dL (ref 1.5–4.5)
Glucose: 80 mg/dL (ref 70–99)
Potassium: 4.2 mmol/L (ref 3.5–5.2)
Sodium: 140 mmol/L (ref 134–144)
Total Protein: 7.3 g/dL (ref 6.0–8.5)
eGFR: 120 mL/min/{1.73_m2} (ref >59)

## 2022-04-20 LAB — CBC WITH DIFFERENTIAL/PLATELET
Basophils Absolute: 0 10*3/uL (ref 0.0–0.2)
Basos: 1 %
EOS (ABSOLUTE): 0.1 10*3/uL (ref 0.0–0.4)
Eos: 1 %
Hematocrit: 43 % (ref 34.0–46.6)
Hemoglobin: 14.1 g/dL (ref 11.1–15.9)
Immature Grans (Abs): 0 10*3/uL (ref 0.0–0.1)
Immature Granulocytes: 0 %
Lymphocytes Absolute: 3.2 10*3/uL — ABNORMAL HIGH (ref 0.7–3.1)
Lymphs: 40 %
MCH: 29.3 pg (ref 26.6–33.0)
MCHC: 32.8 g/dL (ref 31.5–35.7)
MCV: 89 fL (ref 79–97)
Monocytes Absolute: 0.5 10*3/uL (ref 0.1–0.9)
Monocytes: 7 %
Neutrophils Absolute: 4.1 10*3/uL (ref 1.4–7.0)
Neutrophils: 51 %
Platelets: 285 10*3/uL (ref 150–450)
RBC: 4.82 x10E6/uL (ref 3.77–5.28)
RDW: 12.9 % (ref 11.7–15.4)
WBC: 8 10*3/uL (ref 3.4–10.8)

## 2022-04-20 LAB — LIPID PANEL
Chol/HDL Ratio: 4.3 ratio (ref 0.0–4.4)
Cholesterol, Total: 190 mg/dL (ref 100–199)
HDL: 44 mg/dL (ref >39)
LDL Chol Calc (NIH): 108 mg/dL — ABNORMAL HIGH (ref 0–99)
Triglycerides: 221 mg/dL — ABNORMAL HIGH (ref 0–149)
VLDL Cholesterol Cal: 38 mg/dL (ref 5–40)

## 2022-04-20 LAB — TSH: TSH: 1.67 u[IU]/mL (ref 0.450–4.500)

## 2022-07-18 ENCOUNTER — Ambulatory Visit (INDEPENDENT_AMBULATORY_CARE_PROVIDER_SITE_OTHER): Payer: BC Managed Care – PPO | Admitting: Family Medicine

## 2022-07-18 ENCOUNTER — Encounter: Payer: Self-pay | Admitting: Family Medicine

## 2022-07-18 VITALS — BP 120/83 | HR 101 | Ht 66.0 in | Wt 181.0 lb

## 2022-07-18 DIAGNOSIS — F32A Depression, unspecified: Secondary | ICD-10-CM

## 2022-07-18 DIAGNOSIS — E663 Overweight: Secondary | ICD-10-CM

## 2022-07-18 DIAGNOSIS — F419 Anxiety disorder, unspecified: Secondary | ICD-10-CM | POA: Diagnosis not present

## 2022-07-18 MED ORDER — VORTIOXETINE HBR 10 MG PO TABS: 10.0000 mg | ORAL_TABLET | Freq: Every day | ORAL | 0 refills | Status: DC

## 2022-07-18 MED ORDER — DESVENLAFAXINE SUCCINATE ER 50 MG PO TB24: 50.0000 mg | ORAL_TABLET | Freq: Every day | ORAL | 0 refills | Status: DC

## 2022-07-18 MED ORDER — VORTIOXETINE HBR 10 MG PO TABS: 10.0000 mg | ORAL_TABLET | Freq: Every day | ORAL | 1 refills | Status: DC

## 2022-07-18 MED ORDER — WEGOVY 1.7 MG/0.75ML ~~LOC~~ SOAJ: 1.7000 mg | SUBCUTANEOUS | 1 refills | Status: DC

## 2022-07-18 MED ORDER — VORTIOXETINE HBR 5 MG PO TABS: 5.0000 mg | ORAL_TABLET | Freq: Every day | ORAL | 0 refills | Status: DC

## 2022-07-18 NOTE — Progress Notes (Signed)
BP 120/83   Pulse (!) 101   Ht 5\' 6"  (1.676 m)   Wt 181 lb (82.1 kg)   SpO2 97%   BMI 29.21 kg/m    Subjective:   Patient ID: Jenna Mendoza, female    DOB: October 02, 1990, 32 y.o.   MRN: [id number]  HPI: Jenna Mendoza is a 32 y.o. female presenting on 07/18/2022 for Medical Management of Chronic Issues, Anxiety, Depression, and Obesity   HPI Overweight and weight loss Patient is coming in today for recheck on weight and weight loss.  She is currently taking Wegovy 1.7 weekly.  She is down about 4 pounds from 3 months ago but she is just getting very frustrated that she is not losing weight.  She is concerned that maybe one of her anxiety and depression medicines might be contributing.  She would like to taper off her Pristiq and see how she does on something else to help with anxiety and depression.  She is seeing a counselor for that but discussed possibly healthy weight and wellness clinic as well.  She is trying diet and regular walking and regular exercise just feels like she is not losing like she was before.  Anxiety and depression Is coming in to discuss anxiety depression.  She is currently taking Wellbutrin and Pristiq but is frustrated with her weight and so that is making her anxiety depression not due as well.  Wellbutrin and Pristiq    07/18/2022   10:47 AM 07/18/2022   10:46 AM 04/19/2022    3:42 PM 04/19/2022    3:41 PM 01/04/2022    4:17 PM  Depression screen PHQ 2/9  Decreased Interest  0  0 0  Down, Depressed, Hopeless  1  1 1   PHQ - 2 Score  1  1 1   Altered sleeping 1  1  1   Tired, decreased energy 1  2  1   Change in appetite 0  1  1  Feeling bad or failure about yourself  0  0  0  Trouble concentrating 0  0  0  Moving slowly or fidgety/restless 0  0  0  Suicidal thoughts 0  0  0  PHQ-9 Score     4  Difficult doing work/chores Not difficult at all  Somewhat difficult  Not difficult at all   Relevant past medical, surgical, family and social history reviewed and  updated as indicated. Interim medical history since our last visit reviewed. Allergies and medications reviewed and updated.  Review of Systems  Constitutional:  Negative for chills and fever.  Eyes:  Negative for visual disturbance.  Respiratory:  Negative for chest tightness and shortness of breath.   Cardiovascular:  Negative for chest pain and leg swelling.  Skin:  Negative for rash.  Neurological:  Negative for dizziness, light-headedness and headaches.  Psychiatric/Behavioral:  Positive for dysphoric mood. Negative for agitation, behavioral problems, self-injury, sleep disturbance and suicidal ideas. The patient is nervous/anxious.   All other systems reviewed and are negative.   Per HPI unless specifically indicated above   Allergies as of 07/18/2022       Reactions   Shellfish Allergy Anaphylaxis, Swelling        Medication List        Accurate as of July 18, 2022 12:32 PM. If you have any questions, ask your nurse or doctor.          acetaminophen 500 MG tablet Commonly known as: TYLENOL Take 1,000 mg by mouth  every 6 (six) hours as needed for mild pain or headache.   buPROPion 300 MG 24 hr tablet Commonly known as: WELLBUTRIN XL TAKE 1 TABLET(300 MG) BY MOUTH DAILY   desvenlafaxine 50 MG 24 hr tablet Commonly known as: PRISTIQ Take 1 tablet (50 mg total) by mouth daily. What changed:  medication strength how much to take Changed by: Elige Radon Shimeka Bacot, MD   hydrOXYzine 25 MG capsule Commonly known as: VISTARIL Take 1 capsule (25 mg total) by mouth every 8 (eight) hours as needed for anxiety.   Mirena (52 MG) 20 MCG/DAY Iud Generic drug: levonorgestrel 1 Intra Uterine Device by Intrauterine route continuous as needed.   ondansetron 4 MG disintegrating tablet Commonly known as: ZOFRAN-ODT Take 1 tablet (4 mg total) by mouth every 8 (eight) hours as needed for nausea or vomiting.   vortioxetine HBr 10 MG Tabs tablet Commonly known as:  TRINTELLIX Take 1 tablet (10 mg total) by mouth daily. Started by: Elige Radon Farha Dano, MD   vortioxetine HBr 5 MG Tabs tablet Commonly known as: TRINTELLIX Take 1 tablet (5 mg total) by mouth daily. Started by: Elige Radon Dakari Stabler, MD   vortioxetine HBr 10 MG Tabs tablet Commonly known as: TRINTELLIX Take 1 tablet (10 mg total) by mouth daily. Started by: Nils Pyle, MD   Reginal Lutes 1.7 MG/0.75ML Soaj Generic drug: Semaglutide-Weight Management Inject 1.7 mg into the skin once a week.         Objective:   BP 120/83   Pulse (!) 101   Ht 5\' 6"  (1.676 m)   Wt 181 lb (82.1 kg)   SpO2 97%   BMI 29.21 kg/m   Wt Readings from Last 3 Encounters:  07/18/22 181 lb (82.1 kg)  04/19/22 185 lb (83.9 kg)  01/04/22 182 lb (82.6 kg)    Physical Exam Vitals and nursing note reviewed.  Constitutional:      General: She is not in acute distress.    Appearance: She is well-developed. She is not diaphoretic.  Eyes:     Conjunctiva/sclera: Conjunctivae normal.     Pupils: Pupils are equal, round, and reactive to light.  Cardiovascular:     Rate and Rhythm: Normal rate and regular rhythm.     Heart sounds: Normal heart sounds. No murmur heard. Pulmonary:     Effort: Pulmonary effort is normal. No respiratory distress.     Breath sounds: Normal breath sounds. No wheezing.  Musculoskeletal:        General: No tenderness. Normal range of motion.  Skin:    General: Skin is warm and dry.     Findings: No rash.  Neurological:     Mental Status: She is alert and oriented to person, place, and time.     Coordination: Coordination normal.  Psychiatric:        Mood and Affect: Mood is anxious and depressed.        Behavior: Behavior normal.        Thought Content: Thought content does not include suicidal ideation. Thought content does not include suicidal plan.       Assessment & Plan:   Problem List Items Addressed This Visit       Other   Anxiety and depression -  Primary   Relevant Medications   desvenlafaxine (PRISTIQ) 50 MG 24 hr tablet   vortioxetine HBr (TRINTELLIX) 10 MG TABS tablet   vortioxetine HBr (TRINTELLIX) 5 MG TABS tablet   vortioxetine HBr (TRINTELLIX) 10 MG TABS tablet  Other Visit Diagnoses     Overweight       Relevant Medications   Semaglutide-Weight Management (WEGOVY) 1.7 MG/0.75ML SOAJ     Will taper down off the Pristiq, gave her 50 mg for 10 days and then she can start the Trintellix, gave samples of 5 mg and 10 mg and then she can keep going on the 10 mg.  Follow up plan: Return if symptoms worsen or fail to improve, for 6 weeks anxiety and depression.  Counseling provided for all of the vaccine components No orders of the defined types were placed in this encounter.   Arville Care, MD [facility] 07/18/2022, 12:32 PM

## 2022-07-30 ENCOUNTER — Encounter: Payer: Self-pay | Admitting: Family Medicine

## 2022-07-30 ENCOUNTER — Other Ambulatory Visit: Payer: Self-pay | Admitting: Family Medicine

## 2022-07-30 DIAGNOSIS — F32A Anxiety disorder, unspecified: Secondary | ICD-10-CM

## 2022-08-24 ENCOUNTER — Ambulatory Visit: Payer: BC Managed Care – PPO | Admitting: Family Medicine

## 2022-08-27 ENCOUNTER — Encounter: Payer: Self-pay | Admitting: Family Medicine

## 2022-10-18 ENCOUNTER — Ambulatory Visit: Payer: BC Managed Care – PPO | Admitting: Family Medicine

## 2022-10-23 ENCOUNTER — Encounter: Payer: Self-pay | Admitting: Family Medicine

## 2022-10-24 ENCOUNTER — Other Ambulatory Visit: Payer: Self-pay | Admitting: Family Medicine

## 2022-10-24 MED ORDER — BUPROPION HCL ER (XL) 150 MG PO TB24: 150.0000 mg | ORAL_TABLET | Freq: Every day | ORAL | 0 refills | Status: DC

## 2022-10-24 NOTE — Progress Notes (Signed)
Sent Wellbutrin lower dose so she can taper off

## 2022-11-02 ENCOUNTER — Other Ambulatory Visit: Payer: Self-pay | Admitting: Family Medicine

## 2022-11-10 ENCOUNTER — Other Ambulatory Visit: Payer: Self-pay | Admitting: Family Medicine

## 2022-11-10 DIAGNOSIS — F419 Anxiety disorder, unspecified: Secondary | ICD-10-CM

## 2022-11-19 ENCOUNTER — Encounter: Payer: Self-pay | Admitting: Family Medicine

## 2022-11-19 ENCOUNTER — Ambulatory Visit: Payer: BC Managed Care – PPO | Admitting: Family Medicine

## 2022-12-04 ENCOUNTER — Other Ambulatory Visit: Payer: Self-pay | Admitting: Family Medicine

## 2022-12-11 ENCOUNTER — Other Ambulatory Visit: Payer: Self-pay | Admitting: Family Medicine

## 2022-12-11 DIAGNOSIS — F32A Depression, unspecified: Secondary | ICD-10-CM

## 2022-12-15 ENCOUNTER — Other Ambulatory Visit: Payer: Self-pay | Admitting: Family Medicine

## 2022-12-17 ENCOUNTER — Encounter: Payer: Self-pay | Admitting: Family Medicine

## 2022-12-17 NOTE — Telephone Encounter (Signed)
LMTCB to schedule appt Letter mailed 

## 2022-12-17 NOTE — Telephone Encounter (Signed)
Dettinger pt NTBS 30-d given 12/04/22

## 2023-01-01 ENCOUNTER — Telehealth: Payer: BC Managed Care – PPO | Admitting: Physician Assistant

## 2023-01-01 DIAGNOSIS — R3989 Other symptoms and signs involving the genitourinary system: Secondary | ICD-10-CM | POA: Diagnosis not present

## 2023-01-01 MED ORDER — SULFAMETHOXAZOLE-TRIMETHOPRIM 800-160 MG PO TABS: 1.0000 | ORAL_TABLET | Freq: Two times a day (BID) | ORAL | 0 refills | Status: DC

## 2023-01-01 NOTE — Progress Notes (Signed)
Virtual Visit Consent   Jenna Mendoza, you are scheduled for a virtual visit with a [facility] provider today. Just as with appointments in the office, your consent must be obtained to participate. Your consent will be active for this visit and any virtual visit you may have with one of our providers in the next 365 days. If you have a MyChart account, a copy of this consent can be sent to you electronically.  As this is a virtual visit, video technology does not allow for your provider to perform a traditional examination. This may limit your provider's ability to fully assess your condition. If your provider identifies any concerns that need to be evaluated in person or the need to arrange testing (such as labs, EKG, etc.), we will make arrangements to do so. Although advances in technology are sophisticated, we cannot ensure that it will always work on either your end or our end. If the connection with a video visit is poor, the visit may have to be switched to a telephone visit. With either a video or telephone visit, we are not always able to ensure that we have a secure connection.  By engaging in this virtual visit, you consent to the provision of healthcare and authorize for your insurance to be billed (if applicable) for the services provided during this visit. Depending on your insurance coverage, you may receive a charge related to this service.  I need to obtain your verbal consent now. Are you willing to proceed with your visit today? Jenna Mendoza has provided verbal consent on 01/01/2023 for a virtual visit (video or telephone). [name], [place]  Date: 01/01/2023 11:39 AM  Virtual Visit via Video Note   I, [name], connected with  Jenna Mendoza  ([id number], [birth year]) on 01/01/23 at 11:45 AM EST by a video-enabled telemedicine application and verified that I am speaking with the correct person using two identifiers.  Location: Patient: Virtual Visit  Location Patient: Home Provider: Virtual Visit Location Provider: Home Office   I discussed the limitations of evaluation and management by telemedicine and the availability of in person appointments. The patient expressed understanding and agreed to proceed.    History of Present Illness: Jenna Mendoza is a 32 y.o. who identifies as a female who was assigned female at birth, and is being seen today for possible UTI. Patient endorses symptoms starting yesterday with urinary frequency and dysuria, suprapubic pressure. Denies concern for pregnancy -- IUD in place.   HPI: HPI  Problems:  Patient Active Problem List   Diagnosis Date Noted   Hyperlipidemia 01/04/2022   Anxiety and depression 05/01/2021   Calculus of gallbladder without cholecystitis without obstruction 11/03/2020   Non-reassuring fetal heart rate with late deceleration 08/03/2019    Allergies:  Allergies  Allergen Reactions   Shellfish Allergy Anaphylaxis and Swelling   Medications:  Current Outpatient Medications:    sulfamethoxazole-trimethoprim (BACTRIM DS) 800-160 MG tablet, Take 1 tablet by mouth 2 (two) times daily., Disp: 10 tablet, Rfl: 0   acetaminophen (TYLENOL) 500 MG tablet, Take 1,000 mg by mouth every 6 (six) hours as needed for mild pain or headache., Disp: , Rfl:    buPROPion (WELLBUTRIN XL) 150 MG 24 hr tablet, Take 1 tablet (150 mg total) by mouth daily. **NEEDS TO BE SEEN BEFORE NEXT REFILL**, Disp: 14 tablet, Rfl: 0   hydrOXYzine (VISTARIL) 25 MG capsule, Take 1 capsule (25 mg total) by mouth every 8 (eight) hours as needed  for anxiety., Disp: 30 capsule, Rfl: 1   levonorgestrel (MIRENA, 52 MG,) 20 MCG/24HR IUD, 1 Intra Uterine Device by Intrauterine route continuous as needed., Disp: , Rfl:    ondansetron (ZOFRAN-ODT) 4 MG disintegrating tablet, Take 1 tablet (4 mg total) by mouth every 8 (eight) hours as needed for nausea or vomiting., Disp: 30 tablet, Rfl: 0   Semaglutide-Weight Management (WEGOVY)  1.7 MG/0.75ML SOAJ, Inject 1.7 mg into the skin once a week., Disp: 9 mL, Rfl: 1   vortioxetine HBr (TRINTELLIX) 10 MG TABS tablet, Take 1 tablet (10 mg total) by mouth daily., Disp: 28 tablet, Rfl: 0  Observations/Objective: Patient is well-developed, well-nourished in no acute distress.  Resting comfortably at home.  Head is normocephalic, atraumatic.  No labored breathing. Speech is clear and coherent with logical content.  Patient is alert and oriented at baseline.   Assessment and Plan: 1. Suspected UTI (Primary) - sulfamethoxazole-trimethoprim (BACTRIM DS) 800-160 MG tablet; Take 1 tablet by mouth 2 (two) times daily.  Dispense: 10 tablet; Refill: 0  Classic UTI symptoms with absence of alarm signs or symptoms. Prior history of UTI. Will treat empirically with Bactrim for suspected uncomplicated cystitis. Supportive measures and OTC medications reviewed. Strict in-person evaluation precautions discussed.    Follow Up Instructions: I discussed the assessment and treatment plan with the patient. The patient was provided an opportunity to ask questions and all were answered. The patient agreed with the plan and demonstrated an understanding of the instructions.  A copy of instructions were sent to the patient via MyChart unless otherwise noted below.    The patient was advised to call back or seek an in-person evaluation if the symptoms worsen or if the condition fails to improve as anticipated.    [name], PA-C

## 2023-01-01 NOTE — Patient Instructions (Addendum)
Jenna Mendoza, thank you for joining [name], PA-C for today's virtual visit.  While this provider is not your primary care provider (PCP), if your PCP is located in our provider database this encounter information will be shared with them immediately following your visit.   A [facility] MyChart account gives you access to today's visit and all your visits, tests, and labs performed at [facility] " click here if you don't have a [name] MyChart account or go to [email or web address]  Consent: (Patient) Jenna Mendoza provided verbal consent for this virtual visit at the beginning of the encounter.  Current Medications:  Current Outpatient Medications:    acetaminophen (TYLENOL) 500 MG tablet, Take 1,000 mg by mouth every 6 (six) hours as needed for mild pain or headache., Disp: , Rfl:    buPROPion (WELLBUTRIN XL) 150 MG 24 hr tablet, Take 1 tablet (150 mg total) by mouth daily. **NEEDS TO BE SEEN BEFORE NEXT REFILL**, Disp: 14 tablet, Rfl: 0   hydrOXYzine (VISTARIL) 25 MG capsule, Take 1 capsule (25 mg total) by mouth every 8 (eight) hours as needed for anxiety., Disp: 30 capsule, Rfl: 1   levonorgestrel (MIRENA, 52 MG,) 20 MCG/24HR IUD, 1 Intra Uterine Device by Intrauterine route continuous as needed., Disp: , Rfl:    ondansetron (ZOFRAN-ODT) 4 MG disintegrating tablet, Take 1 tablet (4 mg total) by mouth every 8 (eight) hours as needed for nausea or vomiting., Disp: 30 tablet, Rfl: 0   Semaglutide-Weight Management (WEGOVY) 1.7 MG/0.75ML SOAJ, Inject 1.7 mg into the skin once a week., Disp: 9 mL, Rfl: 1   TRINTELLIX 10 MG TABS tablet, TAKE 1 TABLET(10 MG) BY MOUTH DAILY, Disp: 30 tablet, Rfl: 1   vortioxetine HBr (TRINTELLIX) 10 MG TABS tablet, Take 1 tablet (10 mg total) by mouth daily., Disp: 28 tablet, Rfl: 0   vortioxetine HBr (TRINTELLIX) 5 MG TABS tablet, Take 1 tablet (5 mg total) by mouth daily., Disp: 14 tablet, Rfl: 0   Medications ordered in  this encounter:  No orders of the defined types were placed in this encounter.    *If you need refills on other medications prior to your next appointment, please contact your pharmacy*  Follow-Up: Call back or seek an in-person evaluation if the symptoms worsen or if the condition fails to improve as anticipated.   Virtual Care [phone]  Other Instructions Your symptoms are consistent with a bladder infection, also called acute cystitis. Please take your antibiotic (Bactrim) as directed until all pills are gone.  Stay very well hydrated.  Consider a daily probiotic (Align, Culturelle, or Activia) to help prevent stomach upset caused by the antibiotic.  Taking a probiotic daily may also help prevent recurrent UTIs.  Also consider taking AZO (Phenazopyridine) tablets to help decrease pain with urination.   Urinary Tract Infection A urinary tract infection (UTI) can occur any place along the urinary tract. The tract includes the kidneys, ureters, bladder, and urethra. A type of germ called bacteria often causes a UTI. UTIs are often helped with antibiotic medicine.  HOME CARE  If given, take antibiotics as told by your doctor. Finish them even if you start to feel better. Drink enough fluids to keep your pee (urine) clear or pale yellow. Avoid tea, drinks with caffeine, and bubbly (carbonated) drinks. Pee often. Avoid holding your pee in for a long time. Pee before and after having sex (intercourse). Wipe from front to back after you poop (bowel movement) if you  are a woman. Use each tissue only once. GET HELP RIGHT AWAY IF:  You have back pain. You have lower belly (abdominal) pain. You have chills. You feel sick to your stomach (nauseous). You throw up (vomit). Your burning or discomfort with peeing does not go away. You have a fever. Your symptoms are not better in 3 days. MAKE SURE YOU:  Understand these instructions. Will watch your condition. Will get help  right away if you are not doing well or get worse. Document Released: 06/20/2007 Document Revised: 09/26/2011 Document Reviewed: 08/02/2011 [facility] Patient Information 2015 [place], [place]. This information is not intended to replace advice given to you by your health care provider. Make sure you discuss any questions you have with your health care provider.    If you have been instructed to have an in-person evaluation today at a local Urgent Care facility, please use the link below. It will take you to a list of all of our available [facility], including address, phone number and hours of operation. Please do not delay care.  [facility]  If you or a family member do not have a primary care provider, use the link below to schedule a visit and establish care. When you choose a [facility] primary care physician or advanced practice provider, you gain a long-term partner in health. Find a Primary Care Provider  Learn more about [facility]'s in-office and virtual care options: [facility] - Get Care Now

## 2023-01-06 ENCOUNTER — Other Ambulatory Visit: Payer: Self-pay | Admitting: Family Medicine

## 2023-01-11 ENCOUNTER — Other Ambulatory Visit: Payer: Self-pay | Admitting: Family Medicine

## 2023-02-04 ENCOUNTER — Ambulatory Visit (INDEPENDENT_AMBULATORY_CARE_PROVIDER_SITE_OTHER): Payer: 59 | Admitting: Family Medicine

## 2023-02-04 ENCOUNTER — Encounter: Payer: Self-pay | Admitting: Family Medicine

## 2023-02-04 VITALS — BP 146/91 | Temp 98.0°F | Ht 66.0 in | Wt 195.0 lb

## 2023-02-04 DIAGNOSIS — E663 Overweight: Secondary | ICD-10-CM | POA: Diagnosis not present

## 2023-02-04 DIAGNOSIS — E782 Mixed hyperlipidemia: Secondary | ICD-10-CM

## 2023-02-04 DIAGNOSIS — F32A Depression, unspecified: Secondary | ICD-10-CM | POA: Diagnosis not present

## 2023-02-04 DIAGNOSIS — F419 Anxiety disorder, unspecified: Secondary | ICD-10-CM | POA: Diagnosis not present

## 2023-02-04 MED ORDER — ZEPBOUND 7.5 MG/0.5ML ~~LOC~~ SOAJ: 7.5000 mg | SUBCUTANEOUS | 3 refills | Status: DC

## 2023-02-04 MED ORDER — TIRZEPATIDE-WEIGHT MANAGEMENT 5 MG/0.5ML ~~LOC~~ SOLN: 5.0000 mg | SUBCUTANEOUS | 0 refills | Status: DC

## 2023-02-04 MED ORDER — BUPROPION HCL ER (XL) 150 MG PO TB24: 150.0000 mg | ORAL_TABLET | Freq: Every day | ORAL | 1 refills | Status: DC

## 2023-02-04 MED ORDER — VORTIOXETINE HBR 10 MG PO TABS: 10.0000 mg | ORAL_TABLET | Freq: Every day | ORAL | 1 refills | Status: DC

## 2023-02-04 MED ORDER — TIRZEPATIDE-WEIGHT MANAGEMENT 2.5 MG/0.5ML ~~LOC~~ SOAJ: 2.5000 mg | SUBCUTANEOUS | 0 refills | Status: DC

## 2023-02-04 NOTE — Progress Notes (Signed)
BP (!) 146/91   Temp 98 F (36.7 C)   Ht 5\' 6"  (1.676 m)   Wt 195 lb (88.5 kg)   SpO2 97%   BMI 31.47 kg/m    Subjective:   Patient ID: Jenna Mendoza, female    DOB: 1991-01-06, 33 y.o.   MRN: [id number]  HPI: Jenna Mendoza is a 33 y.o. female presenting on 02/04/2023 for Medical Management of Chronic Issues, Anxiety, Depression, and Obesity   HPI Anxiety and depression recheck Patient is coming in today for anxiety depression recheck, she is currently taking Trintellix and Wellbutrin.  Patient said she tried to come down off the medicines and it did not go well.  She has now restarted and has been back on them for about 4 weeks and she feels like things are going in the right direction again and she feels like the medication is helping.  She denies any suicidal ideations.  She says the only thing that she is currently really unhappy about is because she is getting a respiratory illness but then also she has gained a lot of weight back this year after being very thin.    02/04/2023   12:15 PM 07/18/2022   10:47 AM 07/18/2022   10:46 AM 04/19/2022    3:42 PM 04/19/2022    3:41 PM  Depression screen PHQ 2/9  Decreased Interest 1  0  0  Down, Depressed, Hopeless 1  1  1   PHQ - 2 Score 2  1  1   Altered sleeping 1 1  1    Tired, decreased energy 1 1  2    Change in appetite 1 0  1   Feeling bad or failure about yourself  0 0  0   Trouble concentrating 0 0  0   Moving slowly or fidgety/restless 0 0  0   Suicidal thoughts 0 0  0   PHQ-9 Score 5      Difficult doing work/chores Somewhat difficult Not difficult at all  Somewhat difficult     Hyperlipidemia Patient is coming in for recheck of his hyperlipidemia. The patient is currently taking none currently. They deny any issues with myalgias or history of liver damage from it. They deny any focal numbness or weakness or chest pain.   Overweight Patient is coming in to discuss weight.  She has had some issues with weight gain and says  she has gained close to 60 pounds over the past year.  She had gone on Wegovy before and it did help her but then caused too much nausea and so she would like to try something else like Zepbound.  Relevant past medical, surgical, family and social history reviewed and updated as indicated. Interim medical history since our last visit reviewed. Allergies and medications reviewed and updated.  Review of Systems  Constitutional:  Negative for chills and fever.  Eyes:  Negative for visual disturbance.  Respiratory:  Negative for chest tightness and shortness of breath.   Cardiovascular:  Negative for chest pain and leg swelling.  Genitourinary:  Negative for difficulty urinating and dysuria.  Skin:  Negative for rash.  Neurological:  Negative for dizziness, light-headedness and headaches.  Psychiatric/Behavioral:  Positive for dysphoric mood. Negative for agitation, behavioral problems, self-injury, sleep disturbance and suicidal ideas. The patient is nervous/anxious.   All other systems reviewed and are negative.   Per HPI unless specifically indicated above   Allergies as of 02/04/2023       Reactions  Shellfish Allergy Anaphylaxis, Swelling        Medication List        Accurate as of February 04, 2023  1:49 PM. If you have any questions, ask your nurse or doctor.          STOP taking these medications    hydrOXYzine 25 MG capsule Commonly known as: VISTARIL Stopped by: Elige Radon Philipe Laswell   ondansetron 4 MG disintegrating tablet Commonly known as: ZOFRAN-ODT Stopped by: Elige Radon Kendahl Bumgardner   sulfamethoxazole-trimethoprim 800-160 MG tablet Commonly known as: BACTRIM DS Stopped by: Elige Radon Rada Zegers       TAKE these medications    acetaminophen 500 MG tablet Commonly known as: TYLENOL Take 1,000 mg by mouth every 6 (six) hours as needed for mild pain or headache.   buPROPion 150 MG 24 hr tablet Commonly known as: WELLBUTRIN XL Take 1 tablet (150 mg total) by  mouth daily. What changed: additional instructions Changed by: Elige Radon Conner Muegge   Mirena (52 MG) 20 MCG/24HR Iud Generic drug: levonorgestrel 1 Intra Uterine Device by Intrauterine route continuous as needed.   tirzepatide 2.5 MG/0.5ML Pen Commonly known as: ZEPBOUND Inject 2.5 mg into the skin once a week. Started by: Elige Radon Hieu Herms   tirzepatide 5 MG/0.5ML injection vial Inject 5 mg into the skin once a week. Started by: Elige Radon Melissa Pulido   Zepbound 7.5 MG/0.5ML Pen Generic drug: tirzepatide Inject 7.5 mg into the skin once a week. Started by: Elige Radon Saurabh Hettich   vortioxetine HBr 10 MG Tabs tablet Commonly known as: Trintellix Take 1 tablet (10 mg total) by mouth daily. What changed: See the new instructions. Changed by: Elige Radon Shequita Peplinski   [phone] 1.7 MG/0.75ML Soaj Generic drug: Semaglutide-Weight Management Inject 1.7 mg into the skin once a week.         Objective:   BP (!) 146/91   Temp 98 F (36.7 C)   Ht 5\' 6"  (1.676 m)   Wt 195 lb (88.5 kg)   SpO2 97%   BMI 31.47 kg/m   Wt Readings from Last 3 Encounters:  02/04/23 195 lb (88.5 kg)  07/18/22 181 lb (82.1 kg)  04/19/22 185 lb (83.9 kg)    Physical Exam Vitals and nursing note reviewed.  Constitutional:      General: She is not in acute distress.    Appearance: She is well-developed. She is not diaphoretic.  Eyes:     Conjunctiva/sclera: Conjunctivae normal.  Cardiovascular:     Rate and Rhythm: Normal rate and regular rhythm.     Heart sounds: Normal heart sounds. No murmur heard. Pulmonary:     Effort: Pulmonary effort is normal. No respiratory distress.     Breath sounds: Normal breath sounds. No wheezing.  Musculoskeletal:        General: No tenderness. Normal range of motion.  Skin:    General: Skin is warm and dry.     Findings: No rash.  Neurological:     Mental Status: She is alert and oriented to person, place, and time.     Coordination: Coordination normal.   Psychiatric:        Behavior: Behavior normal.       Assessment & Plan:   Problem List Items Addressed This Visit       Other   Anxiety and depression - Primary   Relevant Medications   buPROPion (WELLBUTRIN XL) 150 MG 24 hr tablet   vortioxetine HBr (TRINTELLIX) 10 MG TABS tablet  Hyperlipidemia   Other Visit Diagnoses       Overweight       Relevant Medications   tirzepatide (ZEPBOUND) 2.5 MG/0.5ML Pen   tirzepatide 5 MG/0.5ML injection vial   tirzepatide (ZEPBOUND) 7.5 MG/0.5ML Pen       Patient's BMI is >30 mg/m2.  Patient's current BMI is Body mass index is 31.47 kg/m.Marland Kitchen  Patient is currently enrolled in a healthy eating plan along with encouraged exercise.  Patient is on Wellbutrin and has previously tried phentermine in the past and it did not work patient has contraindications to phentermine, Contrave & Qsymia (contains phentermine).  Patient does not have a personal or family history of medullary thyroid carcinoma (MTC) or Multiple Endocrine Neoplasia syndrome type 2 (MEN 2).  Follow up plan: Return in about 6 months (around 08/04/2023), or if symptoms worsen or fail to improve, for Weight and depression and anxiety recheck.  Counseling provided for all of the vaccine components No orders of the defined types were placed in this encounter.   Arville Care, MD [facility] 02/04/2023, 1:49 PM

## 2023-02-07 ENCOUNTER — Other Ambulatory Visit: Payer: Self-pay | Admitting: Family Medicine

## 2023-02-07 DIAGNOSIS — F32A Depression, unspecified: Secondary | ICD-10-CM

## 2023-03-01 ENCOUNTER — Ambulatory Visit: Payer: 59

## 2023-03-02 ENCOUNTER — Telehealth: Admitting: Family Medicine

## 2023-03-02 DIAGNOSIS — J020 Streptococcal pharyngitis: Secondary | ICD-10-CM

## 2023-03-02 MED ORDER — AMOXICILLIN 500 MG PO CAPS: 500.0000 mg | ORAL_CAPSULE | Freq: Two times a day (BID) | ORAL | 0 refills | Status: AC

## 2023-03-02 NOTE — Progress Notes (Signed)
Virtual Visit Consent   Jenna Mendoza, you are scheduled for a virtual visit with a [facility] provider today. Just as with appointments in the office, your consent must be obtained to participate. Your consent will be active for this visit and any virtual visit you may have with one of our providers in the next 365 days. If you have a MyChart account, a copy of this consent can be sent to you electronically.  As this is a virtual visit, video technology does not allow for your provider to perform a traditional examination. This may limit your provider's ability to fully assess your condition. If your provider identifies any concerns that need to be evaluated in person or the need to arrange testing (such as labs, EKG, etc.), we will make arrangements to do so. Although advances in technology are sophisticated, we cannot ensure that it will always work on either your end or our end. If the connection with a video visit is poor, the visit may have to be switched to a telephone visit. With either a video or telephone visit, we are not always able to ensure that we have a secure connection.  By engaging in this virtual visit, you consent to the provision of healthcare and authorize for your insurance to be billed (if applicable) for the services provided during this visit. Depending on your insurance coverage, you may receive a charge related to this service.  I need to obtain your verbal consent now. Are you willing to proceed with your visit today? Jenna Mendoza has provided verbal consent on 03/02/2023 for a virtual visit (video or telephone). Jenna Mendoza, [place]  Date: 03/02/2023 10:12 AM   Virtual Visit via Video Note   I, Jenna Mendoza, connected with  [name]  ([id number], 12-17-1990) on 03/02/23 at 10:00 AM EST by a video-enabled telemedicine application and verified that I am speaking with the correct person using two identifiers.  Location: Patient: Virtual Visit Location Patient:  Home Provider: Virtual Visit Location Provider: Home Office   I discussed the limitations of evaluation and management by telemedicine and the availability of in person appointments. The patient expressed understanding and agreed to proceed.    History of Present Illness: Jenna Mendoza is a 33 y.o. who identifies as a female who was assigned female at birth, and is being seen today for c/o having a sore throat and took covid and flu test was negative.  Pt states it has been four days with throat pain and covered in white spots.  Pt states she has a swollen lymph node on right side of neck.  Pt states has taken ibuprofen and Tylenol.  Pt states throat feels uncomfortable.  Pt states her daughter had the flu a week and a half ago. Pt denies tonsils touching.   HPI: HPI  Problems:  Patient Active Problem List   Diagnosis Date Noted   Hyperlipidemia 01/04/2022   Anxiety and depression 05/01/2021   Calculus of gallbladder without cholecystitis without obstruction 11/03/2020   Non-reassuring fetal heart rate with late deceleration 08/03/2019    Allergies:  Allergies  Allergen Reactions   Shellfish Allergy Anaphylaxis and Swelling   Medications:  Current Outpatient Medications:    amoxicillin (AMOXIL) 500 MG capsule, Take 1 capsule (500 mg total) by mouth 2 (two) times daily for 10 days., Disp: 20 capsule, Rfl: 0   acetaminophen (TYLENOL) 500 MG tablet, Take 1,000 mg by mouth every 6 (six) hours as needed for mild pain or  headache., Disp: , Rfl:    buPROPion (WELLBUTRIN XL) 150 MG 24 hr tablet, Take 1 tablet (150 mg total) by mouth daily., Disp: 90 tablet, Rfl: 1   levonorgestrel (MIRENA, 52 MG,) 20 MCG/24HR IUD, 1 Intra Uterine Device by Intrauterine route continuous as needed., Disp: , Rfl:    Semaglutide-Weight Management (WEGOVY) 1.7 MG/0.75ML SOAJ, Inject 1.7 mg into the skin once a week., Disp: 9 mL, Rfl: 1   tirzepatide (ZEPBOUND) 2.5 MG/0.5ML Pen, Inject 2.5 mg into the skin once a  week., Disp: 2 mL, Rfl: 0   tirzepatide (ZEPBOUND) 7.5 MG/0.5ML Pen, Inject 7.5 mg into the skin once a week., Disp: 2 mL, Rfl: 3   tirzepatide 5 MG/0.5ML injection vial, Inject 5 mg into the skin once a week., Disp: 2 mL, Rfl: 0   vortioxetine HBr (TRINTELLIX) 10 MG TABS tablet, Take 1 tablet (10 mg total) by mouth daily., Disp: 90 tablet, Rfl: 1  Observations/Objective: Patient is well-developed, well-nourished in no acute distress.  Resting comfortably at home.  Head is normocephalic, atraumatic.  No labored breathing.  Speech is clear and coherent with logical content.  Patient is alert and oriented at baseline.    Assessment and Plan: 1. Strep pharyngitis (Primary) - amoxicillin (AMOXIL) 500 MG capsule; Take 1 capsule (500 mg total) by mouth 2 (two) times daily for 10 days.  Dispense: 20 capsule; Refill: 0  -Start Amoxicillin -Pt to follow up in person with PCP or urgent care for worsening symptoms.   Follow Up Instructions: I discussed the assessment and treatment plan with the patient. The patient was provided an opportunity to ask questions and all were answered. The patient agreed with the plan and demonstrated an understanding of the instructions.  A copy of instructions were sent to the patient via MyChart unless otherwise noted below.     The patient was advised to call back or seek an in-person evaluation if the symptoms worsen or if the condition fails to improve as anticipated.    Jenna Pandy, PA-C

## 2023-03-02 NOTE — Patient Instructions (Signed)
Jenna Mendoza, thank you for joining [name], PA-C for today's virtual visit.  While this provider is not your primary care provider (PCP), if your PCP is located in our provider database this encounter information will be shared with them immediately following your visit.   A [facility] MyChart account gives you access to today's visit and all your visits, tests, and labs performed at [facility] " click here if you don't have a [facility] MyChart account or go to [email or web address]  Consent: (Patient) Jenna Mendoza provided verbal consent for this virtual visit at the beginning of the encounter.  Current Medications:  Current Outpatient Medications:    amoxicillin (AMOXIL) 500 MG capsule, Take 1 capsule (500 mg total) by mouth 2 (two) times daily for 10 days., Disp: 20 capsule, Rfl: 0   acetaminophen (TYLENOL) 500 MG tablet, Take 1,000 mg by mouth every 6 (six) hours as needed for mild pain or headache., Disp: , Rfl:    buPROPion (WELLBUTRIN XL) 150 MG 24 hr tablet, Take 1 tablet (150 mg total) by mouth daily., Disp: 90 tablet, Rfl: 1   levonorgestrel (MIRENA, 52 MG,) 20 MCG/24HR IUD, 1 Intra Uterine Device by Intrauterine route continuous as needed., Disp: , Rfl:    Semaglutide-Weight Management (WEGOVY) 1.7 MG/0.75ML SOAJ, Inject 1.7 mg into the skin once a week., Disp: 9 mL, Rfl: 1   tirzepatide (ZEPBOUND) 2.5 MG/0.5ML Pen, Inject 2.5 mg into the skin once a week., Disp: 2 mL, Rfl: 0   tirzepatide (ZEPBOUND) 7.5 MG/0.5ML Pen, Inject 7.5 mg into the skin once a week., Disp: 2 mL, Rfl: 3   tirzepatide 5 MG/0.5ML injection vial, Inject 5 mg into the skin once a week., Disp: 2 mL, Rfl: 0   vortioxetine HBr (TRINTELLIX) 10 MG TABS tablet, Take 1 tablet (10 mg total) by mouth daily., Disp: 90 tablet, Rfl: 1   Medications ordered in this encounter:  Meds ordered this encounter  Medications   amoxicillin (AMOXIL) 500 MG capsule    Sig: Take 1 capsule (500 mg  total) by mouth 2 (two) times daily for 10 days.    Dispense:  20 capsule    Refill:  0     *If you need refills on other medications prior to your next appointment, please contact your pharmacy*  Follow-Up: Call back or seek an in-person evaluation if the symptoms worsen or if the condition fails to improve as anticipated.  [facility] [phone]  Other Instructions Strep Throat, Adult Strep throat is an infection in the throat that is caused by bacteria. It is common during the cold months of the year. It mostly affects children who are 55-92 years old. However, people of all ages can get it at any time of the year. This infection spreads from person to person (is contagious) through coughing, sneezing, or having close contact. Your health care provider may use other names to describe the infection. When strep throat affects the tonsils, it is called tonsillitis. When it affects the back of the throat, it is called pharyngitis. What are the causes? This condition is caused by the Streptococcus pyogenes bacteria. What increases the risk? You are more likely to develop this condition if: You care for school-age children, or are around school-age children. Children are more likely to get strep throat and may spread it to others. You spend time in crowded places where the infection can spread easily. You have close contact with someone who has strep throat. What  are the signs or symptoms? Symptoms of this condition include: Fever or chills. Redness, swelling, or pain in the tonsils or throat. Pain or difficulty when swallowing. White or yellow spots on the tonsils or throat. Tender glands in the neck and under the jaw. Bad smelling breath. Red rash all over the body. This is rare. How is this diagnosed? This condition is diagnosed by tests that check for the presence and the amount of bacteria that cause strep throat. They are: Rapid strep test. Your throat is swabbed  and checked for the presence of bacteria. Results are usually ready in minutes. Throat culture test. Your throat is swabbed. The sample is placed in a cup that allows infections to grow. Results are usually ready in 1 or 2 days. How is this treated? This condition may be treated with: Medicines that kill germs (antibiotics). Medicines that relieve pain or fever. These include: Ibuprofen or acetaminophen. Aspirin, only for people who are over the age of 61. Throat lozenges. Throat sprays. Follow these instructions at home: Medicines  Take over-the-counter and prescription medicines only as told by your health care provider. Take your antibiotic medicine as told by your health care provider. Do not stop taking the antibiotic even if you start to feel better. Eating and drinking  If you have trouble swallowing, try eating soft foods until your sore throat feels better. Drink enough fluid to keep your urine pale yellow. To help relieve pain, you may have: Warm fluids, such as soup and tea. Cold fluids, such as frozen desserts or popsicles. General instructions Gargle with a salt-water mixture 3-4 times a day or as needed. To make a salt-water mixture, completely dissolve -1 tsp (3-6 g) of salt in 1 cup (237 mL) of warm water. Get plenty of rest. Stay home from work or school until you have been taking antibiotics for 24 hours. Do not use any products that contain nicotine or tobacco. These products include cigarettes, chewing tobacco, and vaping devices, such as e-cigarettes. If you need help quitting, ask your health care provider. It is up to you to get your test results. Ask your health care provider, or the department that is doing the test, when your results will be ready. Keep all follow-up visits. This is important. How is this prevented?  Do not share food, drinking cups, or personal items that could cause the infection to spread to other people. Wash your hands often with soap  and water for at least 20 seconds. If soap and water are not available, use hand sanitizer. Make sure that all people in your house wash their hands well. Have family members tested if they have a sore throat or fever. They may need an antibiotic if they have strep throat. Contact a health care provider if: You have swelling in your neck that keeps getting bigger. You develop a rash, cough, or earache. You cough up a thick mucus that is green, yellow-brown, or bloody. You have pain or discomfort that does not get better with medicine. Your symptoms seem to be getting worse. You have a fever. Get help right away if: You have new symptoms, such as vomiting, severe headache, stiff or painful neck, chest pain, or shortness of breath. You have severe throat pain, drooling, or changes in your voice. You have swelling of the neck, or the skin on the neck becomes red and tender. You have signs of dehydration, such as tiredness (fatigue), dry mouth, and decreased urination. You become increasingly sleepy, or you  cannot wake up completely. Your joints become red or painful. These symptoms may represent a serious problem that is an emergency. Do not wait to see if the symptoms will go away. Get medical help right away. Call your local emergency services (911 in the [place] Do not drive yourself to the hospital. Summary Strep throat is an infection in the throat that is caused by the Streptococcus pyogenes bacteria. This infection is spread from person to person (is contagious) through coughing, sneezing, or having close contact. Take your medicines, including antibiotics, as told by your health care provider. Do not stop taking the antibiotic even if you start to feel better. To prevent the spread of germs, wash your hands well with soap and water. Have others do the same. Do not share food, drinking cups, or personal items. Get help right away if you have new symptoms, such as vomiting, severe headache,  stiff or painful neck, chest pain, or shortness of breath. This information is not intended to replace advice given to you by your health care provider. Make sure you discuss any questions you have with your health care provider. Document Revised: 04/26/2020 Document Reviewed: 04/26/2020 Elsevier Patient Education  2024 [employer]   If you have been instructed to have an in-person evaluation today at a local Urgent Care facility, please use the link below. It will take you to a list of all of our available [facility], including address, phone number and hours of operation. Please do not delay care.  [facility]  If you or a family member do not have a primary care provider, use the link below to schedule a visit and establish care. When you choose a [facility] primary care physician or advanced practice provider, you gain a long-term partner in health. Find a Primary Care Provider  Learn more about [facility]'s in-office and virtual care options: [facility] - Get Care Now

## 2023-03-06 ENCOUNTER — Other Ambulatory Visit: Payer: Self-pay | Admitting: Family Medicine

## 2023-03-06 DIAGNOSIS — E663 Overweight: Secondary | ICD-10-CM

## 2023-03-21 ENCOUNTER — Other Ambulatory Visit: Payer: Self-pay | Admitting: Family Medicine

## 2023-03-21 DIAGNOSIS — E663 Overweight: Secondary | ICD-10-CM

## 2023-03-21 MED ORDER — TIRZEPATIDE-WEIGHT MANAGEMENT 5 MG/0.5ML ~~LOC~~ SOLN: 5.0000 mg | SUBCUTANEOUS | 0 refills | Status: DC

## 2023-05-20 ENCOUNTER — Other Ambulatory Visit: Payer: Self-pay | Admitting: Family Medicine

## 2023-05-20 DIAGNOSIS — E663 Overweight: Secondary | ICD-10-CM

## 2023-05-27 ENCOUNTER — Ambulatory Visit: Payer: 59 | Admitting: Family Medicine

## 2023-05-28 ENCOUNTER — Encounter: Payer: Self-pay | Admitting: Family Medicine

## 2023-06-25 ENCOUNTER — Encounter: Payer: Self-pay | Admitting: Family Medicine

## 2023-08-08 ENCOUNTER — Ambulatory Visit (INDEPENDENT_AMBULATORY_CARE_PROVIDER_SITE_OTHER): Admitting: Family Medicine

## 2023-08-08 ENCOUNTER — Other Ambulatory Visit (HOSPITAL_COMMUNITY)
Admission: RE | Admit: 2023-08-08 | Discharge: 2023-08-08 | Disposition: A | Discharge status: Home / Self Care | Source: Ambulatory Visit | Attending: Family Medicine | Admitting: Family Medicine

## 2023-08-08 ENCOUNTER — Other Ambulatory Visit (HOSPITAL_COMMUNITY): Payer: Self-pay

## 2023-08-08 ENCOUNTER — Encounter: Payer: Self-pay | Admitting: Family Medicine

## 2023-08-08 ENCOUNTER — Telehealth: Payer: Self-pay

## 2023-08-08 VITALS — BP 135/83 | HR 87 | Ht 66.0 in | Wt 208.0 lb

## 2023-08-08 DIAGNOSIS — Z01419 Encounter for gynecological examination (general) (routine) without abnormal findings: Secondary | ICD-10-CM | POA: Insufficient documentation

## 2023-08-08 DIAGNOSIS — E782 Mixed hyperlipidemia: Secondary | ICD-10-CM | POA: Diagnosis not present

## 2023-08-08 DIAGNOSIS — F32A Depression, unspecified: Secondary | ICD-10-CM

## 2023-08-08 DIAGNOSIS — Z6833 Body mass index (BMI) 33.0-33.9, adult: Secondary | ICD-10-CM

## 2023-08-08 DIAGNOSIS — E66811 Obesity, class 1: Secondary | ICD-10-CM

## 2023-08-08 DIAGNOSIS — F419 Anxiety disorder, unspecified: Secondary | ICD-10-CM | POA: Diagnosis not present

## 2023-08-08 LAB — LIPID PANEL

## 2023-08-08 MED ORDER — BUPROPION HCL ER (XL) 150 MG PO TB24: 150.0000 mg | ORAL_TABLET | Freq: Every day | ORAL | 1 refills | Status: DC

## 2023-08-08 MED ORDER — VORTIOXETINE HBR 10 MG PO TABS: 10.0000 mg | ORAL_TABLET | Freq: Every day | ORAL | 1 refills | Status: DC

## 2023-08-08 MED ORDER — PHENTERMINE HCL 37.5 MG PO CAPS: 37.5000 mg | ORAL_CAPSULE | ORAL | 0 refills | Status: DC

## 2023-08-08 NOTE — Progress Notes (Signed)
 BP 135/83   Pulse 87   Ht 5' 6 (1.676 m)   Wt 208 lb (94.3 kg)   SpO2 99%   BMI 33.57 kg/m    Subjective:   Patient ID: Jenna Mendoza, female    DOB: 11-05-1990, 33 y.o.   MRN: [id number]  HPI: Jenna Mendoza is a 33 y.o. female presenting on 08/08/2023 for Medical Management of Chronic Issues (CPE with pap)   HPI Physical exam Patient denies any chest pain, shortness of breath, headaches or vision issues, abdominal complaints, diarrhea, nausea, vomiting, or joint issues.   Hyperlipidemia Patient is coming in for recheck of his hyperlipidemia. The patient is currently taking no medication currently, diet control. They deny any issues with myalgias or history of liver damage from it. They deny any focal numbness or weakness or chest pain.   Anxiety depression recheck Patient feels like she is doing well with anxiety and depression.  She takes Wellbutrin  150 and Trintellix  10 mg.  She denies any suicidal ideations.  She says she just is in the process of transferring jobs but she is feeling okay about that and her relationship with her family and spouse is going well as well.    08/08/2023   10:03 AM 02/04/2023   12:15 PM 07/18/2022   10:47 AM 07/18/2022   10:46 AM 04/19/2022    3:42 PM  Depression screen PHQ 2/9  Decreased Interest 1 1  0   Down, Depressed, Hopeless 1 1  1    PHQ - 2 Score 2 2  1    Altered sleeping 2 1 1  1   Tired, decreased energy 2 1 1  2   Change in appetite 2 1 0  1  Feeling bad or failure about yourself  1 0 0  0  Trouble concentrating 0 0 0  0  Moving slowly or fidgety/restless 0 0 0  0  Suicidal thoughts 0 0 0  0  PHQ-9 Score 9 5     Difficult doing work/chores Not difficult at all Somewhat difficult Not difficult at all  Somewhat difficult    Obesity and weight loss Patient would like to come in to discuss obesity and weight loss.  She had the Zepbound  but it caused a lot of stomach issues so she has not been taking it.  She would like to do  phentermine  again for short course and then continue with the Zepbound  from there if she can.  She says she has been trying diet and lifestyle modification at home and trying to be more active.  She did say that the change in jobs  Relevant past medical, surgical, family and social history reviewed and updated as indicated. Interim medical history since our last visit reviewed. Allergies and medications reviewed and updated.  Review of Systems  Constitutional:  Negative for chills and fever.  Eyes:  Negative for visual disturbance.  Respiratory:  Negative for chest tightness and shortness of breath.   Cardiovascular:  Negative for chest pain and leg swelling.  Genitourinary:  Negative for difficulty urinating and dysuria.  Musculoskeletal:  Negative for back pain and gait problem.  Skin:  Negative for rash.  Neurological:  Negative for dizziness, light-headedness and headaches.  Psychiatric/Behavioral:  Negative for agitation and behavioral problems.   All other systems reviewed and are negative.   Per HPI unless specifically indicated above   Allergies as of 08/08/2023       Reactions   Shellfish Allergy Anaphylaxis, Swelling  Medication List        Accurate as of August 08, 2023 10:32 AM. If you have any questions, ask your nurse or doctor.          STOP taking these medications    Wegovy  1.7 MG/0.75ML Soaj Generic drug: Semaglutide -Weight Management Stopped by: Fonda LABOR Erasto Sleight       TAKE these medications    acetaminophen  500 MG tablet Commonly known as: TYLENOL  Take 1,000 mg by mouth every 6 (six) hours as needed for mild pain or headache.   buPROPion  150 MG 24 hr tablet Commonly known as: WELLBUTRIN  XL Take 1 tablet (150 mg total) by mouth daily.   Mirena  (52 MG) 20 MCG/24HR Iud Generic drug: levonorgestrel  1 Intra Uterine Device by Intrauterine route continuous as needed.   phentermine  37.5 MG capsule Take 1 capsule (37.5 mg total) by mouth  every morning. Started by: Fonda LABOR Mckaylah Bettendorf   tirzepatide  2.5 MG/0.5ML Pen Commonly known as: ZEPBOUND  Inject 2.5 mg into the skin once a week.   Zepbound  7.5 MG/0.5ML Pen Generic drug: tirzepatide  Inject 7.5 mg into the skin once a week.   Zepbound  5 MG/0.5ML Pen Generic drug: tirzepatide  INJECT 5 MG UNDER THE SKIN ONCE A WEEK.   vortioxetine  HBr 10 MG Tabs tablet Commonly known as: Trintellix  Take 1 tablet (10 mg total) by mouth daily.         Objective:   BP 135/83   Pulse 87   Ht 5' 6 (1.676 m)   Wt 208 lb (94.3 kg)   SpO2 99%   BMI 33.57 kg/m   Wt Readings from Last 3 Encounters:  08/08/23 208 lb (94.3 kg)  02/04/23 195 lb (88.5 kg)  07/18/22 181 lb (82.1 kg)    Physical Exam Vitals and nursing note reviewed.  Constitutional:      General: She is not in acute distress.    Appearance: She is well-developed. She is not diaphoretic.  Eyes:     Conjunctiva/sclera: Conjunctivae normal.  Cardiovascular:     Rate and Rhythm: Normal rate and regular rhythm.     Heart sounds: Normal heart sounds. No murmur heard. Pulmonary:     Effort: Pulmonary effort is normal. No respiratory distress.     Breath sounds: Normal breath sounds. No wheezing.  Musculoskeletal:        General: No swelling.  Skin:    General: Skin is warm and dry.     Findings: No rash.  Neurological:     Mental Status: She is alert and oriented to person, place, and time.     Coordination: Coordination normal.  Psychiatric:        Behavior: Behavior normal.       Assessment & Plan:   Problem List Items Addressed This Visit       Other   Anxiety and depression   Relevant Medications   buPROPion  (WELLBUTRIN  XL) 150 MG 24 hr tablet   vortioxetine  HBr (TRINTELLIX ) 10 MG TABS tablet   Other Relevant Orders   CBC with Differential/Platelet   CMP14+EGFR   Hyperlipidemia   Relevant Orders   CBC with Differential/Platelet   CMP14+EGFR   Lipid panel   Other Visit Diagnoses        Well woman exam with routine gynecological exam    -  Primary   Relevant Orders   CBC with Differential/Platelet   CMP14+EGFR   Lipid panel   Cytology - PAP([place])     Obesity (BMI 30.0-34.9)  Relevant Medications   phentermine  37.5 MG capsule     Continue current medicine, patient is struggling more with the weight and appetite and would like to try another short course of phentermine , we will go ahead and do that her BMI today is 33.  Follow up plan: Return if symptoms worsen or fail to improve, for 1 month weight recheck.  Counseling provided for all of the vaccine components Orders Placed This Encounter  Procedures   CBC with Differential/Platelet   CMP14+EGFR   Lipid panel    Fonda Levins, MD [facility] 08/08/2023, 10:32 AM

## 2023-08-08 NOTE — Telephone Encounter (Signed)
 Pharmacy Patient Advocate Encounter   Received notification from CoverMyMeds that prior authorization for Phentermine  HCl 37.5MG  capsules is required/requested.   Insurance verification completed.   The patient is insured through [facility] .   Per test claim: PA required; PA started via CoverMyMeds. KEY [id number] . Waiting for clinical questions to populate.  PLEASE BE ADVISED Clinical questions have been answered and PA submitted.TO PLAN. PA currently Pending.

## 2023-08-09 ENCOUNTER — Other Ambulatory Visit: Payer: Self-pay | Admitting: Family Medicine

## 2023-08-09 DIAGNOSIS — F32A Depression, unspecified: Secondary | ICD-10-CM

## 2023-08-09 LAB — LIPID PANEL
Cholesterol, Total: 208 mg/dL — AB (ref 100–199)
HDL: 40 mg/dL (ref >39)
LDL CALC COMMENT:: 5.2 ratio — AB (ref 0.0–4.4)
LDL Chol Calc (NIH): 134 mg/dL — AB (ref 0–99)
Triglycerides: 188 mg/dL — ABNORMAL HIGH (ref 0–149)
VLDL Cholesterol Cal: 34 mg/dL (ref 5–40)

## 2023-08-09 LAB — CMP14+EGFR
ALT: 13 IU/L (ref 0–32)
AST: 15 IU/L (ref 0–40)
Albumin: 4.3 g/dL (ref 3.9–4.9)
Alkaline Phosphatase: 64 IU/L (ref 44–121)
BUN/Creatinine Ratio: 16 (ref 9–23)
BUN: 11 mg/dL (ref 6–20)
Bilirubin Total: 0.2 mg/dL (ref 0.0–1.2)
CO2: 16 mmol/L — ABNORMAL LOW (ref 20–29)
Calcium: 8.9 mg/dL (ref 8.7–10.2)
Chloride: 102 mmol/L (ref 96–106)
Creatinine, Ser: 0.69 mg/dL (ref 0.57–1.00)
Globulin, Total: 3 g/dL (ref 1.5–4.5)
Glucose: 130 mg/dL — ABNORMAL HIGH (ref 70–99)
Potassium: 3.8 mmol/L (ref 3.5–5.2)
Sodium: 136 mmol/L (ref 134–144)
Total Protein: 7.3 g/dL (ref 6.0–8.5)
eGFR: 118 mL/min/1.73 (ref >59)

## 2023-08-09 LAB — CBC WITH DIFFERENTIAL/PLATELET
Basophils Absolute: 0 x10E3/uL (ref 0.0–0.2)
Basos: 0 %
EOS (ABSOLUTE): 0.1 x10E3/uL (ref 0.0–0.4)
Eos: 1 %
Hematocrit: 42.8 % (ref 34.0–46.6)
Hemoglobin: 14.1 g/dL (ref 11.1–15.9)
Immature Grans (Abs): 0 x10E3/uL (ref 0.0–0.1)
Immature Granulocytes: 0 %
Lymphocytes Absolute: 2 x10E3/uL (ref 0.7–3.1)
Lymphs: 38 %
MCH: 29.4 pg (ref 26.6–33.0)
MCHC: 32.9 g/dL (ref 31.5–35.7)
MCV: 89 fL (ref 79–97)
Monocytes Absolute: 0.3 x10E3/uL (ref 0.1–0.9)
Monocytes: 5 %
Neutrophils Absolute: 2.9 x10E3/uL (ref 1.4–7.0)
Neutrophils: 56 %
Platelets: 252 x10E3/uL (ref 150–450)
RBC: 4.79 x10E6/uL (ref 3.77–5.28)
RDW: 12.8 % (ref 11.7–15.4)
WBC: 5.3 x10E3/uL (ref 3.4–10.8)

## 2023-08-09 NOTE — Telephone Encounter (Signed)
 Pharmacy Patient Advocate Encounter  Received notification from [facility] that Prior Authorization for Phentermine  HCl 37.5MG  capsules  has been APPROVED from 05/09/2023 to 11/08/2023   PA #/Case ID/Reference #:  [id number]

## 2023-08-14 LAB — CYTOLOGY - PAP
Adequacy: ABSENT
Diagnosis: NEGATIVE

## 2023-08-15 ENCOUNTER — Ambulatory Visit: Payer: Self-pay | Admitting: Family Medicine

## 2023-08-29 ENCOUNTER — Encounter: Payer: Self-pay | Admitting: Family Medicine

## 2023-08-29 ENCOUNTER — Ambulatory Visit: Admitting: Family Medicine

## 2023-08-29 VITALS — BP 136/86 | HR 108 | Ht 66.0 in | Wt 208.0 lb

## 2023-08-29 DIAGNOSIS — E66811 Obesity, class 1: Secondary | ICD-10-CM

## 2023-08-29 DIAGNOSIS — R590 Localized enlarged lymph nodes: Secondary | ICD-10-CM | POA: Diagnosis not present

## 2023-08-29 MED ORDER — PHENTERMINE HCL 37.5 MG PO CAPS
37.5000 mg | ORAL_CAPSULE | ORAL | 0 refills | Status: DC
Start: 2023-08-29 — End: 2023-11-28

## 2023-08-29 NOTE — Progress Notes (Signed)
 BP 136/86   Pulse (!) 108   Ht 5' 6 (1.676 m)   Wt 208 lb (94.3 kg)   SpO2 96%   BMI 33.57 kg/m    Subjective:   Patient ID: Jenna Mendoza, female    DOB: [birth year], 33 y.o.   MRN: [id number]  HPI: Jenna Mendoza is a 33 y.o. female presenting on 08/29/2023 for Medical Management of Chronic Issues, Weight Check (208lb on 08/08/23), and Discuss elevated blood pressure   Discussed the use of AI scribe software for clinical note transcription with the patient, who gave verbal consent to proceed.  History of Present Illness   Jenna Mendoza is a 33 year old female who presents for obesity and weight recheck.  She has been taking phentermine  37.5 mg daily for the past month. Initially, she experienced jitteriness but now does not notice a significant difference in her weight. Her dietary habits include skipping breakfast, having a small lunch, and eating dinner without snacking. She consumes diet beverages, usually one or two a day, and drinks a lot of water. She does not consume coffee or tea. Her physical activity is limited to walking up and down her driveway, and she has not been engaging in regular exercise. She is frustrated that weight loss was easier a few years ago.  Previously, she tried Zepbound , reaching a dose of 5 mg, but experienced daily vomiting and bloating, particularly in the upper abdomen, and only lost about 1.5 pounds per month, which she felt was not worth the side effects.  Her anxiety and depression are manageable, though she feels easily overwhelmed and overstimulated. She is currently taking Trintellix  at a relatively low dose. She sleeps about six to seven hours a night, often staying up late to unwind after her children are in bed. 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  She reports a little congestion recently, which she attributes to weather  changes. She reports getting about six to seven hours of sleep per night.      Relevant past medical, surgical, family and social history reviewed and updated as indicated. Interim medical history since our last visit reviewed. Allergies and medications reviewed and updated.  Review of Systems  Constitutional:  Negative for chills and fever.  HENT:  Positive for congestion and postnasal drip. Negative for trouble swallowing.   Eyes:  Negative for visual disturbance.  Respiratory:  Positive for cough. Negative for chest tightness and shortness of breath.   Cardiovascular:  Negative for chest pain and leg swelling.  Genitourinary:  Negative for difficulty urinating and dysuria.  Musculoskeletal:  Negative for back pain and gait problem.  Skin:  Negative for rash.  Neurological:  Negative for light-headedness and headaches.  Psychiatric/Behavioral:  Negative for agitation and behavioral problems.   All other systems reviewed and are negative.   Per HPI unless specifically indicated above   Allergies as of 08/29/2023       Reactions   Shellfish Allergy Anaphylaxis, Swelling        Medication List        Accurate as of August 29, 2023  9:44 AM. If you have any questions, ask your nurse or doctor.          STOP taking these medications    tirzepatide  2.5 MG/0.5ML Pen Commonly known as: ZEPBOUND  Stopped by: Fonda LABOR Cyanna Neace   Zepbound  5 MG/0.5ML Pen Generic drug: tirzepatide   Stopped by: Fonda LABOR Lanyla Costello   Zepbound  7.5 MG/0.5ML Pen Generic drug: tirzepatide  Stopped by: Fonda LABOR Aneri Slagel       TAKE these medications    acetaminophen  500 MG tablet Commonly known as: TYLENOL  Take 1,000 mg by mouth every 6 (six) hours as needed for mild pain or headache.   buPROPion  150 MG 24 hr tablet Commonly known as: WELLBUTRIN  XL Take 1 tablet (150 mg total) by mouth daily.   Mirena  (52 MG) 20 MCG/24HR Iud Generic drug: levonorgestrel  1 Intra Uterine Device by  Intrauterine route continuous as needed.   phentermine  37.5 MG capsule Take 1 capsule (37.5 mg total) by mouth every morning.   vortioxetine  HBr 10 MG Tabs tablet Commonly known as: Trintellix  Take 1 tablet (10 mg total) by mouth daily.         Objective:   BP 136/86   Pulse (!) 108   Ht 5' 6 (1.676 m)   Wt 208 lb (94.3 kg)   SpO2 96%   BMI 33.57 kg/m   Wt Readings from Last 3 Encounters:  08/29/23 208 lb (94.3 kg)  08/08/23 208 lb (94.3 kg)  02/04/23 195 lb (88.5 kg)    Physical Exam Vitals and nursing note reviewed.  Constitutional:      General: She is not in acute distress.    Appearance: She is well-developed. She is not diaphoretic.  HENT:     Mouth/Throat:     Pharynx: Oropharynx is clear. No pharyngeal swelling or posterior oropharyngeal erythema.     Tonsils: No tonsillar exudate.  Eyes:     Conjunctiva/sclera: Conjunctivae normal.  Cardiovascular:     Rate and Rhythm: Normal rate and regular rhythm.     Heart sounds: Normal heart sounds. No murmur heard. Pulmonary:     Effort: Pulmonary effort is normal. No respiratory distress.     Breath sounds: Normal breath sounds. No wheezing.  Musculoskeletal:        General: No tenderness. Normal range of motion.  Lymphadenopathy:     Cervical: Cervical adenopathy present.     Right cervical: Superficial cervical adenopathy (Mobile and slightly tender and enlarged on the right side) present.  Skin:    General: Skin is warm and dry.     Findings: No rash.  Neurological:     Mental Status: She is alert and oriented to person, place, and time.     Coordination: Coordination normal.  Psychiatric:        Behavior: Behavior normal.    Physical Exam   NECK: Right lymph node slightly tender CHEST: Lungs clear to auscultation CARDIOVASCULAR: Heart sounds normal        Assessment & Plan:   Problem List Items Addressed This Visit   None Visit Diagnoses       Obesity (BMI 30.0-34.9)    -  Primary    Relevant Medications   phentermine  37.5 MG capsule     Anterior cervical lymphadenopathy              Obesity Obesity management with phentermine  37.5 mg for the past month showed no significant weight change. Initial jitteriness resolved. Stress and lack of sleep may contribute to weight management challenges. Discussed cortisol's impact on weight loss. - Continue phentermine  37.5 mg for another month. - Encourage increased sleep to 7-8 hours per night to help reduce cortisol levels. - Recommend stress-reducing activities such as yoga or meditation. - Encourage regular physical activity to aid in weight management. - Re-evaluate weight and  medication efficacy in 4 weeks.  Anxiety and depression Anxiety and depression well-managed with Trintellix . Discussed potential dosage increase if symptoms worsen, particularly to aid motivation. - Continue current dose of Trintellix . - Consider increasing medication dosage if irritability or motivation decreases. - Encourage stress management techniques to help with anxiety.  Enlarged right cervical lymph node Persistent enlarged right cervical lymph node with no signs of infection. Similar lymph node enlargement with colds. - Monitor lymph node for changes or worsening symptoms. - Advise to report any new symptoms or concerns.      Follow up plan: Return in about 4 weeks (around 09/26/2023), or if symptoms worsen or fail to improve, for obesity.  Counseling provided for all of the vaccine components No orders of the defined types were placed in this encounter.   Fonda Levins, MD [facility] 08/29/2023, 9:44 AM

## 2023-09-03 IMAGING — CT CT ABD-PELV W/ CM
2 of 4 series · 15 of 46 positions shown, 17 images · IV contrast (Omnipaque or Isovue)
Comparison: Ultrasound October 27, 2020

CLINICAL DATA: Abdominal pain status post cholecystectomy on
11/15/2020.

EXAM:
CT ABDOMEN AND PELVIS WITH CONTRAST
TECHNIQUE: Multidetector CT imaging of the abdomen and pelvis was performed
using the standard protocol following bolus administration of
intravenous contrast.
CONTRAST:  100mL OMNIPAQUE IOHEXOL 300 MG/ML  SOLN

[Series 2: axial st · axial · 0.83mm/px · z∈[-887,-462]mm · 12 of 97 slices shown, 14 images]
[im 8/97  soft-tissue]
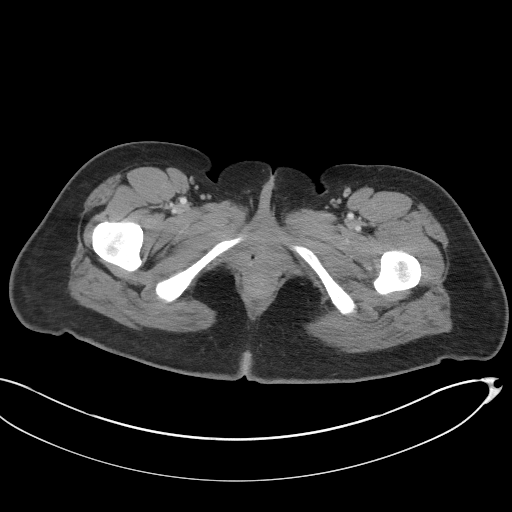
[im 8/97  bone]
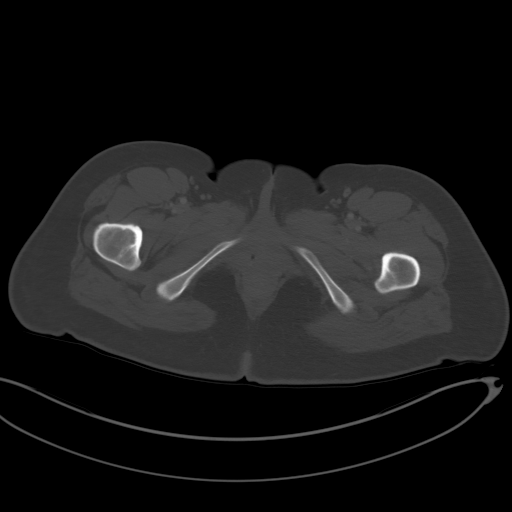
[im 16/97  soft-tissue]
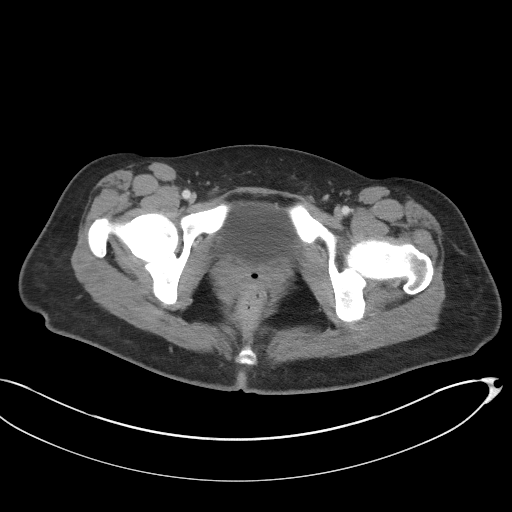
[im 24/97  soft-tissue]
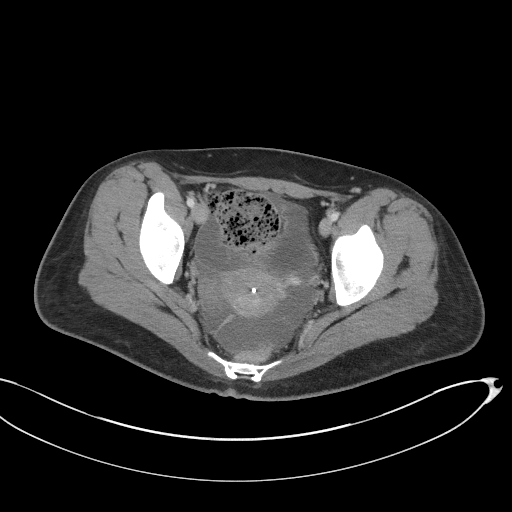
[im 31/97  soft-tissue]
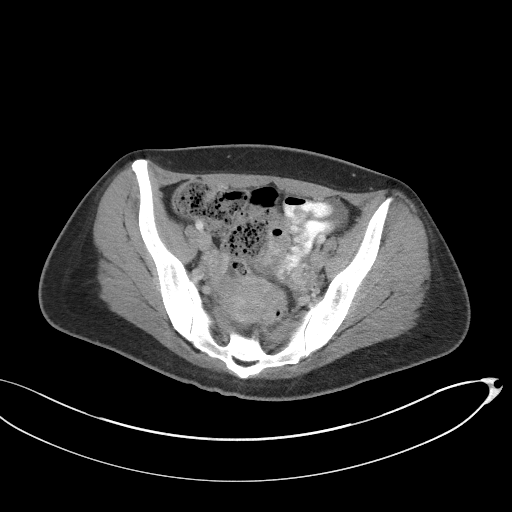
[im 39/97  soft-tissue]
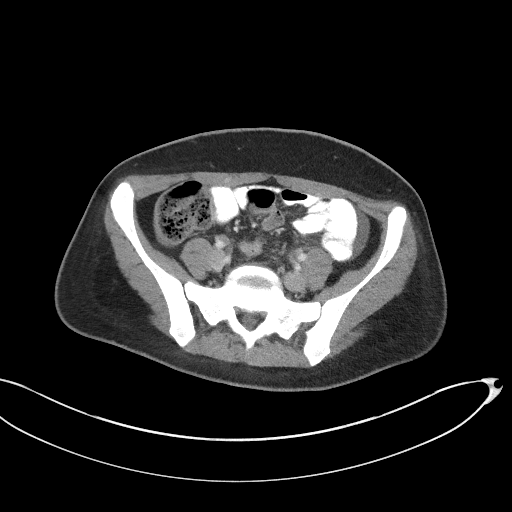
[im 47/97  soft-tissue]
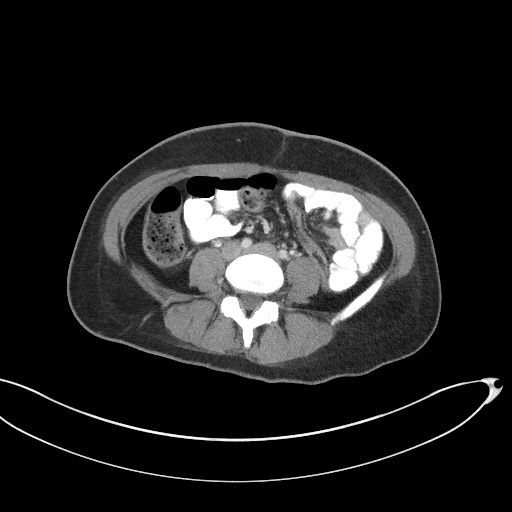
[im 54/97  soft-tissue]
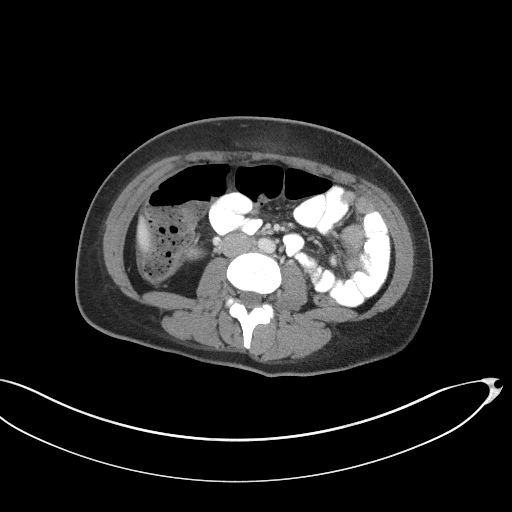
[im 62/97  soft-tissue]
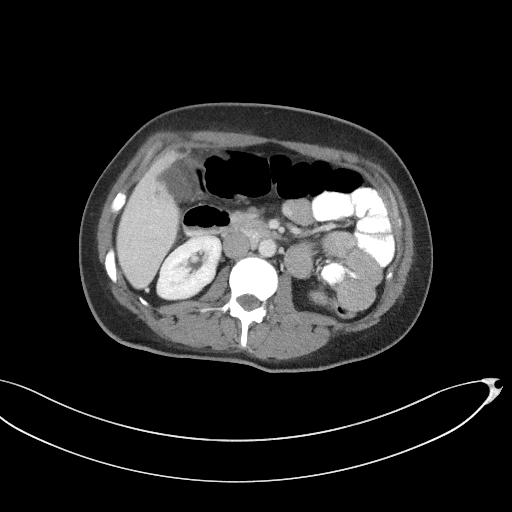
[im 70/97  soft-tissue]
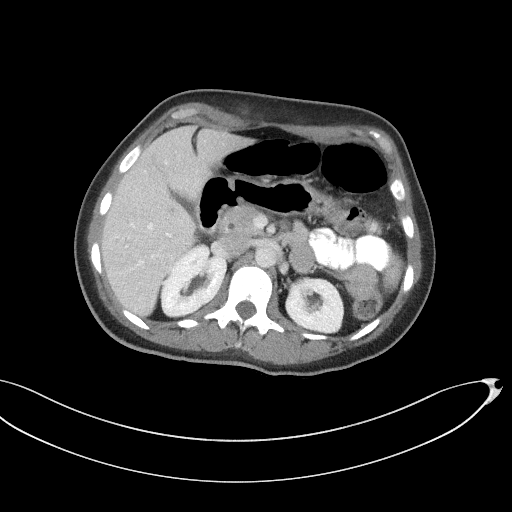
[im 70/97  bone]
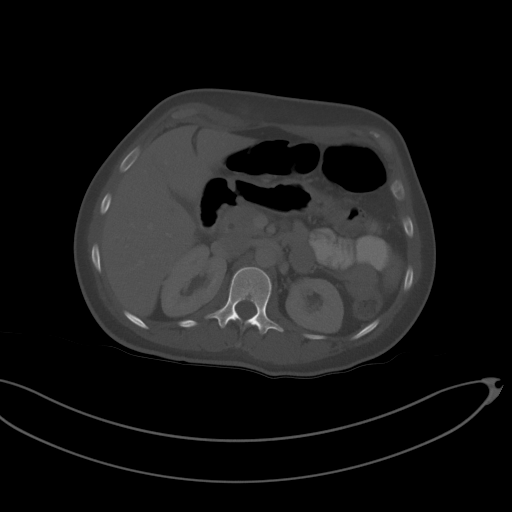
[im 77/97  soft-tissue]
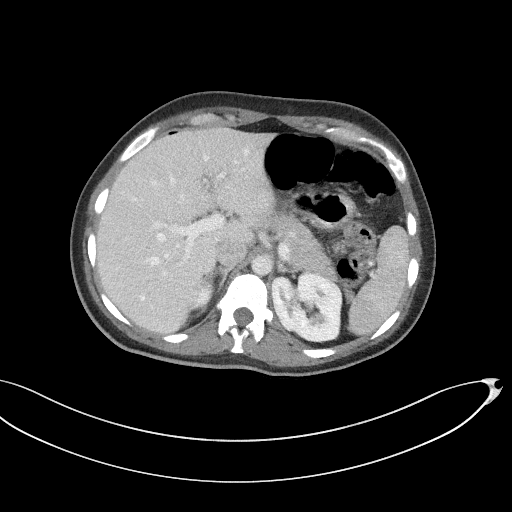
[im 85/97  soft-tissue]
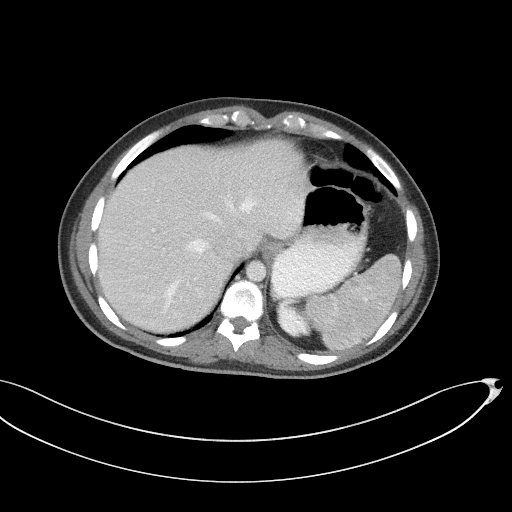
[im 93/97  soft-tissue]
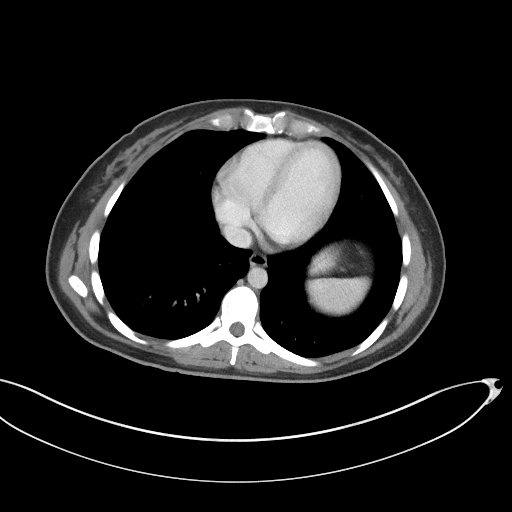

[Series 5: coronal st · coronal · 0.75mm/px · 3 of 97 slices shown]
[im 33/97  soft-tissue]
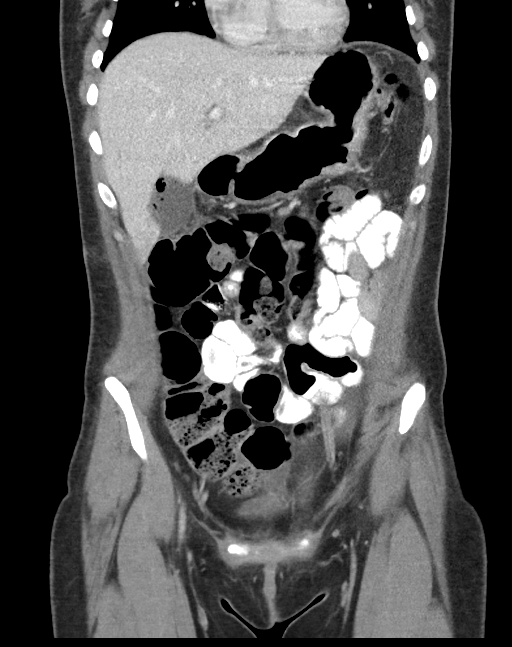
[im 43/97  soft-tissue]
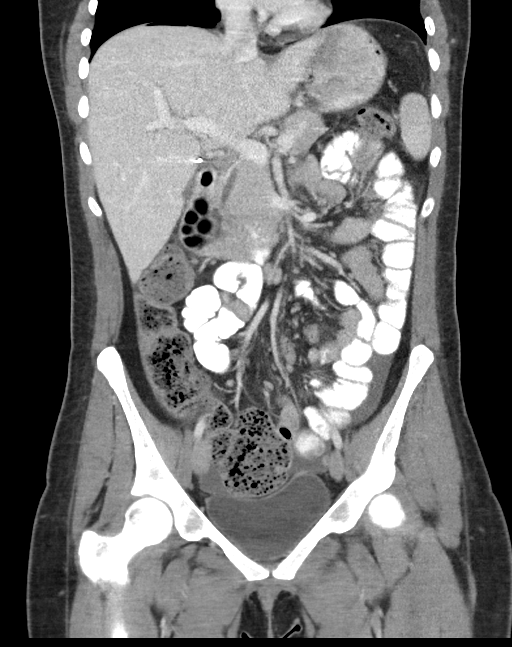
[im 54/97  soft-tissue]
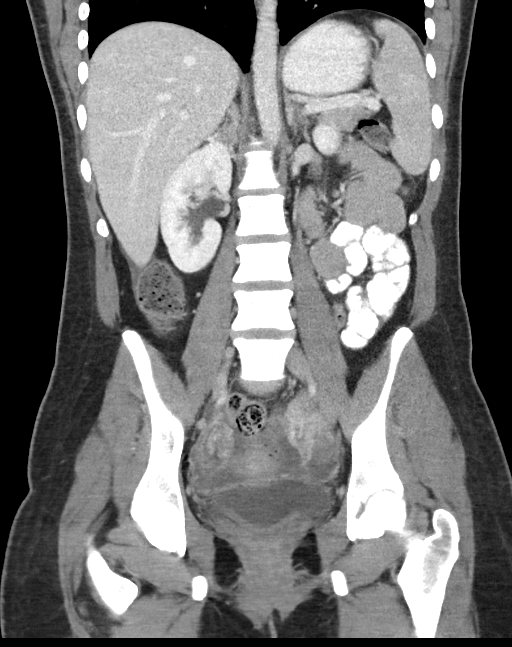

[15 of 46 positions shown; findings below may reference images not displayed]

FINDINGS: Lower chest: No acute abnormality.

Hepatobiliary: No suspicious hepatic lesion. Gallbladder surgically
absent. Gas and fluid collection in the gallbladder fossa measuring
3.7 x 2.3 cm. No biliary ductal dilation.

Pancreas: No pancreatic ductal dilation or evidence of acute
inflammation.

Spleen: Within normal limits.

Adrenals/Urinary Tract: Bilateral adrenal glands are unremarkable.
No hydronephrosis. No solid enhancing renal mass. Urinary bladder is
unremarkable for degree of distension.

Stomach/Bowel: Enteric contrast was administered a traverses distal
loops of small bowel. No extraluminal contrast material. Stomach is
unremarkable for degree of distension. No pathologic dilation of
small or large bowel. No evidence of acute bowel inflammation.

Vascular/Lymphatic: No abdominal aortic aneurysm. No pathologically
enlarged abdominal or pelvic lymph nodes.

Reproductive: Intrauterine device appears appropriate in
positioning. Bilateral ovaries are unremarkable.

Other: Small volume of pelvic free fluid. Trace pneumoperitoneum,
favored postoperative. Postoperative change in the anterior
abdominal wall with trocar site in the right upper quadrant and a
small amount of subcutaneous gas and edema at the umbilical trocar
site.

Musculoskeletal: No acute or significant osseous findings.
IMPRESSION: 1. Postsurgical change of recent cholecystectomy with small volume
pelvic free fluid and trace pneumoperitoneum, within normal limits
for recent laparoscopic surgery. Additionally within the
cholecystectomy bed there is a gas and fluid collection which
measures 3.7 x 2.3 cm, correlation for use surgical SNOW/Surgicel
during cholecystectomy is suggested as utilization of this material
would favor this to reflect associated postsurgical change. However,
if this material was not utilized this will most likely reflect a
postsurgical abscess.
2. Postoperative change in the anterior abdominal wall with trocar
site in the right upper quadrant and a small amount of subcutaneous
gas and edema at the umbilical trocar site.

## 2023-09-10 ENCOUNTER — Telehealth: Admitting: Physician Assistant

## 2023-09-10 DIAGNOSIS — R519 Headache, unspecified: Secondary | ICD-10-CM

## 2023-09-10 DIAGNOSIS — R110 Nausea: Secondary | ICD-10-CM

## 2023-09-10 MED ORDER — IBUPROFEN 600 MG PO TABS: 600.0000 mg | ORAL_TABLET | Freq: Three times a day (TID) | ORAL | 0 refills | Status: AC | PRN

## 2023-09-10 MED ORDER — ONDANSETRON 4 MG PO TBDP: 4.0000 mg | ORAL_TABLET | Freq: Three times a day (TID) | ORAL | 0 refills | Status: AC | PRN

## 2023-09-10 NOTE — Patient Instructions (Addendum)
 Jenna Mendoza, thank you for joining Frayda Egley, PA-C for today's virtual visit.  While this provider is not your primary care provider (PCP), if your PCP is located in our provider database this encounter information will be shared with them immediately following your visit.   A [facility] MyChart account gives you access to today's visit and all your visits, tests, and labs performed at [facility]  click here if you don't have a [facility] MyChart account or go to [email or web address]  Consent: (Patient) Jenna Mendoza provided verbal consent for this virtual visit at the beginning of the encounter.  Current Medications:  Current Outpatient Medications:    ibuprofen  (ADVIL ) 600 MG tablet, Take 1 tablet (600 mg total) by mouth every 8 (eight) hours as needed for up to 7 days., Disp: 21 tablet, Rfl: 0   ondansetron  (ZOFRAN -ODT) 4 MG disintegrating tablet, Take 1 tablet (4 mg total) by mouth every 8 (eight) hours as needed for up to 3 days for nausea or vomiting., Disp: 9 tablet, Rfl: 0   acetaminophen  (TYLENOL ) 500 MG tablet, Take 1,000 mg by mouth every 6 (six) hours as needed for mild pain or headache., Disp: , Rfl:    buPROPion  (WELLBUTRIN  XL) 150 MG 24 hr tablet, Take 1 tablet (150 mg total) by mouth daily., Disp: 90 tablet, Rfl: 1   levonorgestrel  (MIRENA , 52 MG,) 20 MCG/24HR IUD, 1 Intra Uterine Device by Intrauterine route continuous as needed., Disp: , Rfl:    phentermine  37.5 MG capsule, Take 1 capsule (37.5 mg total) by mouth every morning., Disp: 30 capsule, Rfl: 0   vortioxetine  HBr (TRINTELLIX ) 10 MG TABS tablet, Take 1 tablet (10 mg total) by mouth daily., Disp: 90 tablet, Rfl: 1   Medications ordered in this encounter:  Meds ordered this encounter  Medications   ondansetron  (ZOFRAN -ODT) 4 MG disintegrating tablet    Sig: Take 1 tablet (4 mg total) by mouth every 8 (eight) hours as needed for up to 3 days for nausea or vomiting.    Dispense:  9  tablet    Refill:  0    Supervising Provider:   LAMPTEY, PHILIP O [[id number]]   ibuprofen  (ADVIL ) 600 MG tablet    Sig: Take 1 tablet (600 mg total) by mouth every 8 (eight) hours as needed for up to 7 days.    Dispense:  21 tablet    Refill:  0    Supervising Provider:   BLAISE ALEENE KIDD [[id number]]     *If you need refills on other medications prior to your next appointment, please contact your pharmacy*  Follow-Up: Call back or seek an in-person evaluation if the symptoms worsen or if the condition fails to improve as anticipated.  [facility] [phone]  Other Instructions  Drink plenty of fluids, especially if you are also experiencing nausea or vomiting.  Lie down in a dark, quiet room until your headache subsides.  Place a cool, moist cloth or ice pack on your forehead or other painful areas.  Use your prescribed acute medications, and/or over-the-counter pain relievers, as soon as possible for the best results.  Avoid triggers:Step away from screens, bright lights, and strong smells if they worsen your symptoms.   General Headache Without Cause A headache is pain or discomfort you feel around the head or neck area. There are many causes and types of headaches. In some cases, the cause may not be found. Follow these instructions at home: Watch your condition for  any changes. Let your doctor know about them. Take these steps to help with your condition: Managing pain     Take over-the-counter and prescription medicines only as told by your doctor. This includes medicines for pain that are taken by mouth or put on the skin. Lie down in a dark, quiet room when you have a headache. If told, put ice on your head and neck area: Put ice in a plastic bag. Place a towel between your skin and the bag. Leave the ice on for 20 minutes, 2-3 times per day. Take off the ice if your skin turns bright red. This is very important. If you cannot feel pain, heat, or cold, you  have a greater risk of damage to the area. If told, put heat on the affected area. Use the heat source that your doctor recommends, such as a moist heat pack or a heating pad. Place a towel between your skin and the heat source. Leave the heat on for 20-30 minutes. Take off the heat if your skin turns bright red. This is very important. If you cannot feel pain, heat, or cold, you have a greater risk of getting burned. Keep lights dim if bright lights bother you or make your headaches worse. Eating and drinking Eat meals on a regular schedule. If you drink alcohol: Limit how much you have to: 0-1 drink a day for women who are not pregnant. 0-2 drinks a day for men. Know how much alcohol is in a drink. In the [place] one drink equals one 12 oz bottle of beer (355 mL), one 5 oz glass of wine (148 mL), or one 1 oz glass of hard liquor (44 mL). Stop drinking caffeine, or drink less caffeine. General instructions  Keep a journal to find out if certain things bring on headaches. For example, write down: What you eat and drink. How much sleep you get. Any change to your diet or medicines. Get a massage or try other ways to relax. Limit stress. Sit up straight. Do not tighten (tense) your muscles. Do not smoke or use any products that contain nicotine or tobacco. If you need help quitting, ask your doctor. Exercise regularly as told by your doctor. Get enough sleep. This often means 7-9 hours of sleep each night. Keep all follow-up visits. This is important. Contact a doctor if: Medicine does not help your symptoms. You have a headache that feels different than the other headaches. You feel like you may vomit (nauseous) or you vomit. You have a fever. Get help right away if: Your headache: Gets very bad quickly. Gets worse after a lot of physical activity. You have any of these symptoms: You continue to vomit. A stiff neck. Trouble seeing. Your eye or ear hurts. Trouble speaking. Weak  muscles or you lose muscle control. You lose your balance or have trouble walking. You feel like you will pass out (faint) or you pass out. You are mixed up (confused). You have a seizure. These symptoms may be an emergency. Get help right away. Call your local emergency services (911 in the [place] Do not wait to see if the symptoms will go away. Do not drive yourself to the hospital. Summary A headache is pain or discomfort that is felt around the head or neck area. There are many causes and types of headaches. In some cases, the cause may not be found. Keep a journal to help find out what causes your headaches. Watch your condition for any changes. Let  your doctor know about them. Contact a doctor if you have a headache that is different from usual, or if medicine does not help your headache. Get help right away if your headache gets very bad, you throw up, you have trouble seeing, you lose your balance, or you have a seizure. This information is not intended to replace advice given to you by your health care provider. Make sure you discuss any questions you have with your health care provider. Document Revised: 06/01/2020 Document Reviewed: 06/01/2020 Elsevier Patient Education  2024 Elsevier Inc.   If you have been instructed to have an in-person evaluation today at a local Urgent Care facility, please use the link below. It will take you to a list of all of our available [facility], including address, phone number and hours of operation. Please do not delay care.  [facility]  If you or a family member do not have a primary care provider, use the link below to schedule a visit and establish care. When you choose a [facility] primary care physician or advanced practice provider, you gain a long-term partner in health. Find a Primary Care Provider  Learn more about [facility]'s in-office and virtual care options: [facility] - Get Care Now

## 2023-09-10 NOTE — Progress Notes (Signed)
 Virtual Visit Consent   Jenna Mendoza, you are scheduled for a virtual visit with a [facility] provider today. Just as with appointments in the office, your consent must be obtained to participate. Your consent will be active for this visit and any virtual visit you may have with one of our providers in the next 365 days. If you have a MyChart account, a copy of this consent can be sent to you electronically.  As this is a virtual visit, video technology does not allow for your provider to perform a traditional examination. This may limit your provider's ability to fully assess your condition. If your provider identifies any concerns that need to be evaluated in person or the need to arrange testing (such as labs, EKG, etc.), we will make arrangements to do so. Although advances in technology are sophisticated, we cannot ensure that it will always work on either your end or our end. If the connection with a video visit is poor, the visit may have to be switched to a telephone visit. With either a video or telephone visit, we are not always able to ensure that we have a secure connection.  By engaging in this virtual visit, you consent to the provision of healthcare and authorize for your insurance to be billed (if applicable) for the services provided during this visit. Depending on your insurance coverage, you may receive a charge related to this service.  I need to obtain your verbal consent now. Are you willing to proceed with your visit today? Jenna Mendoza has provided verbal consent on 09/10/2023 for a virtual visit (video or telephone). Hildagarde Holleran, PA-C  Date: 09/10/2023 11:36 AM   Virtual Visit via Video Note   I, Jenna Mendoza, connected with  LAVIDA PATCH  ([id number], April 22, 1990) on 09/10/23 at 11:00 AM EDT by a video-enabled telemedicine application and verified that I am speaking with the correct person using two identifiers.  Location: Patient: Virtual Visit Location Patient:  Other: work Provider: Pharmacist, community: Home Office   I discussed the limitations of evaluation and management by telemedicine and the availability of in person appointments. The patient expressed understanding and agreed to proceed.    History of Present Illness: Jenna Mendoza is a 33 y.o. who identifies as a female who was assigned female at birth, and is being seen today for headache. Reports onset this morning, when was walked work. Pt reports sudden onset of vision changes after she turned on the lamp in her office, states that her central vision was  clocked flickering, this lasted about 45 min intense initially and tapered , during which she started getting an headache. Vision has returned normal but continues to endorse a 7/10 pressure like headache, bilateral, temporal area that's non-radiating. Associated with nausea, no vomiting, no tearing of eyes, nasal congestion, denies recent URI symptoms,hearing changes. Reports headache is similar to headaches in the past, but more intense. No head injuries. Family hx of migraines, no personal hx Of note, reports she recently started phentermine , took it for 2 weeks, but stopped it one week ago. Tried  Excedrin max strength,acetaminophen  250 mg x 2 without any relief. Wears contacts for vision, no changes in RX for contacts.    HPI: HPI  Problems:   Allergies:  Allergies  Allergen Reactions   Shellfish Allergy Anaphylaxis and Swelling   Medications:  Current Outpatient Medications:    ibuprofen  (ADVIL ) 600 MG tablet, Take 1 tablet (600 mg total) by mouth every  8 (eight) hours as needed for up to 7 days., Disp: 21 tablet, Rfl: 0   ondansetron  (ZOFRAN -ODT) 4 MG disintegrating tablet, Take 1 tablet (4 mg total) by mouth every 8 (eight) hours as needed for up to 3 days for nausea or vomiting., Disp: 9 tablet, Rfl: 0   acetaminophen  (TYLENOL ) 500 MG tablet, Take 1,000 mg by mouth every 6 (six) hours as needed for mild pain or  headache., Disp: , Rfl:    buPROPion  (WELLBUTRIN  XL) 150 MG 24 hr tablet, Take 1 tablet (150 mg total) by mouth daily., Disp: 90 tablet, Rfl: 1   levonorgestrel  (MIRENA , 52 MG,) 20 MCG/24HR IUD, 1 Intra Uterine Device by Intrauterine route continuous as needed., Disp: , Rfl:    phentermine  37.5 MG capsule, Take 1 capsule (37.5 mg total) by mouth every morning., Disp: 30 capsule, Rfl: 0   vortioxetine  HBr (TRINTELLIX ) 10 MG TABS tablet, Take 1 tablet (10 mg total) by mouth daily., Disp: 90 tablet, Rfl: 1  Observations/Objective: Patient is well-developed, well-nourished in no acute distress.  Sitting comfortably at work office.  Head is normocephalic, atraumatic.  No labored breathing.  Speech is clear and coherent with logical content. No slurred speech, no nasal congestion noted. Patient is alert and oriented at baseline.    Assessment and Plan: 1. Acute nonintractable headache, unspecified headache type (Primary) - ondansetron  (ZOFRAN -ODT) 4 MG disintegrating tablet; Take 1 tablet (4 mg total) by mouth every 8 (eight) hours as needed for up to 3 days for nausea or vomiting.  Dispense: 9 tablet; Refill: 0 - ibuprofen  (ADVIL ) 600 MG tablet; Take 1 tablet (600 mg total) by mouth every 8 (eight) hours as needed for up to 7 days.  Dispense: 21 tablet; Refill: 0  2. Nausea   Recommended Tylenol  1000 mg every 6 hours (Caution with Excedrin due to caffeine content, do not take with tylenol  as Excedrin contains acetaminophen ) Alternate with ibuprofen  600 mg every 8 hours if needed  Zofran  for nausea as needed  Follow-up with PCP regarding phentermine . Always consult with your doctor prior to stopping any medication  Drink plenty of fluids, especially if you are also experiencing nausea or vomiting.  Lie down in a dark, quiet room until your headache subsides.  Place a cool, moist cloth or ice pack on your forehead or other painful areas.  Use your prescribed acute medications, and/or  over-the-counter pain relievers, as soon as possible for the best results.  Avoid triggers:Step away from screens, bright lights, and strong smells if they worsen your symptoms.   Follow Up Instructions: I discussed the assessment and treatment plan with the patient. The patient was provided an opportunity to ask questions and all were answered. The patient agreed with the plan and demonstrated an understanding of the instructions.  A copy of instructions were sent to the patient via MyChart unless otherwise noted below.    The patient was advised to call back or seek an in-person evaluation if the symptoms worsen or if the condition fails to improve as anticipated. ED precautions discussed with patient, if worsening headache, vomiting, vision changes,numbness, tingling in extremities, change in speech.      Turner Kunzman, PA-C

## 2023-09-26 ENCOUNTER — Ambulatory Visit: Admitting: Family Medicine

## 2023-09-30 ENCOUNTER — Encounter: Payer: Self-pay | Admitting: Family Medicine

## 2023-10-21 ENCOUNTER — Encounter: Payer: Self-pay | Admitting: Family Medicine

## 2023-10-22 ENCOUNTER — Other Ambulatory Visit (HOSPITAL_COMMUNITY): Payer: Self-pay

## 2023-10-22 ENCOUNTER — Telehealth: Payer: Self-pay | Admitting: Pharmacy Technician

## 2023-10-22 NOTE — Telephone Encounter (Signed)
 Patient called back. Gave instruction to enroll in [place]. Will check Thursday morning to see if it's been updated and submit PA.

## 2023-10-22 NOTE — Telephone Encounter (Signed)
 Pharmacy Patient Advocate Encounter   Received notification from Patient Advice Request messages that prior authorization for Zepbound  is required/requested.   Insurance verification completed.   The patient is insured through [employer] [facility] [place]).   Per test claim: Pt must enroll in [place] [facility] prior to us  being able to submit the PA. Called and left a msg for the pt to call me back.  [place] is [phone]- once enrolled, we can submit the PA. It does take a couple days for [facility] to see that the pt has enrolled.

## 2023-10-22 NOTE — Telephone Encounter (Signed)
 PA request has been Received. New Encounter has been or will be created for follow up. For additional info see Pharmacy Prior Auth telephone encounter from 10/22/23.

## 2023-10-24 ENCOUNTER — Other Ambulatory Visit (HOSPITAL_COMMUNITY): Payer: Self-pay

## 2023-10-24 NOTE — Telephone Encounter (Signed)
 Pharmacy Patient Advocate Encounter   Per test claim: PA required; PA submitted to above mentioned insurance via Latent Key/confirmation #/EOC [id number] Status is pending

## 2023-10-29 ENCOUNTER — Other Ambulatory Visit (HOSPITAL_COMMUNITY): Payer: Self-pay

## 2023-10-29 NOTE — Telephone Encounter (Signed)
 Pharmacy Patient Advocate Encounter  Received notification from [facility] that Prior Authorization for Zepbound  has been APPROVED from 10/16/23 to 06/25/24. Ran test claim, Copay is $0. This test claim was processed through [facility]- copay amounts may vary at other pharmacies due to pharmacy/plan contracts, or as the patient moves through the different stages of their insurance plan.  Pt actually called in before we received the approval, so I did a test claim to find that it has been approved. She will call her pharmacy to fill.   PA #/Case ID/Reference #: [id number]

## 2023-11-04 ENCOUNTER — Telehealth: Payer: Self-pay | Admitting: *Deleted

## 2023-11-04 ENCOUNTER — Telehealth: Payer: Self-pay

## 2023-11-04 ENCOUNTER — Other Ambulatory Visit (HOSPITAL_COMMUNITY): Payer: Self-pay

## 2023-11-04 NOTE — Telephone Encounter (Signed)
 Left message making pharmacy aware.

## 2023-11-04 NOTE — Telephone Encounter (Signed)
 Fax from [employer] RE: Trintellix  10 mg 1 po every day Note: not covered by pt's insurance Preferred alternatives: Citalopram, Escitalopram, Fluoxetine HCL, Fluvoxamine maleate ER, Paroxetine HCL, Sertraline  Please advise on alternative and send in new script

## 2023-11-04 NOTE — Telephone Encounter (Signed)
 Pharmacy Patient Advocate Encounter   Received notification from Pt Calls Messages that prior authorization for Trintellix  (formerly Brintellix) 10MG  tablets   is required/requested.   Insurance verification completed.   The patient is insured through [employer].   Per test claim: The current 90 day co-pay is, $0.00.  No PA needed at this time. This test claim was processed through [facility]- copay amounts may vary at other pharmacies due to pharmacy/plan contracts, or as the patient moves through the different stages of their insurance plan.

## 2023-11-04 NOTE — Telephone Encounter (Signed)
 Patient has been stable on Trintellix  for over a year.  Please run prior authorization.  Patient has tried sertraline  and desvenlafaxine  and is also currently on Wellbutrin .  Please run prior authorization

## 2023-11-05 NOTE — Telephone Encounter (Unsigned)
 Copied from CRM #[id number]. Topic: Clinical - Medication Prior Auth >> Nov 05, 2023  2:28 PM Tinnie BROCKS wrote: Reason for CRM: Rosina from [facility] #[id number] calling to let us  know she received a VM from someone named Rosina Potters stating that a PA is not needed for trintellix  if it is ran as generic. She states that there is no generic for this medication so a PA is needed. Callback# if needed is [phone]

## 2023-11-06 ENCOUNTER — Other Ambulatory Visit (HOSPITAL_COMMUNITY): Payer: Self-pay

## 2023-11-06 NOTE — Telephone Encounter (Signed)
 Good morning! I was able to run a test claim and receive a paid claim. It went through patient's primary and secondary.

## 2023-11-11 ENCOUNTER — Encounter: Payer: Self-pay | Admitting: Family Medicine

## 2023-11-12 ENCOUNTER — Telehealth: Payer: Self-pay

## 2023-11-12 NOTE — Telephone Encounter (Signed)
 Copied from CRM [id number]. Topic: Clinical - Prescription Issue >> Nov 12, 2023  1:19 PM Avram MATSU wrote: Reason for CRM: patient is having an issue with her insurance. Stated they wont approve the medication until a PA is proven that she needs it or that pt tried other options. Pt stated she been on this medication for a while.  vortioxetine  HBr (TRINTELLIX ) 10 MG TABS tablet [[id number]] [phone] (M)

## 2023-11-13 ENCOUNTER — Other Ambulatory Visit (HOSPITAL_COMMUNITY): Payer: Self-pay

## 2023-11-13 ENCOUNTER — Telehealth: Payer: Self-pay

## 2023-11-13 NOTE — Telephone Encounter (Signed)
 Pharmacy Patient Advocate Encounter   Received notification from Pt Calls Messages that prior authorization for Trintellix  10mg  tabs is required/requested.   Insurance verification completed.   The patient is insured through [employer].   Per test claim: PA required; PA submitted to above mentioned insurance via Latent Key/confirmation #/EOC [id number] Status is pending

## 2023-11-13 NOTE — Telephone Encounter (Signed)
 Pharmacy Patient Advocate Encounter  Received notification from [facility] that Prior Authorization for Trintellix  10mg  tabs has been APPROVED from 10/16/23 to 11/12/24. Ran test claim, Copay is $0. This test claim was processed through [facility]- copay amounts may vary at other pharmacies due to pharmacy/plan contracts, or as the patient moves through the different stages of their insurance plan.   PA #/Case ID/Reference #: [id number]

## 2023-11-13 NOTE — Telephone Encounter (Signed)
 Left detailed message making patient aware that PA for Trintellix  was approved. Advised patient to call back if she had any questions.

## 2023-11-28 ENCOUNTER — Ambulatory Visit (INDEPENDENT_AMBULATORY_CARE_PROVIDER_SITE_OTHER): Admitting: Family Medicine

## 2023-11-28 ENCOUNTER — Encounter: Payer: Self-pay | Admitting: Family Medicine

## 2023-11-28 ENCOUNTER — Ambulatory Visit (HOSPITAL_COMMUNITY)
Admission: RE | Admit: 2023-11-28 | Discharge: 2023-11-28 | Disposition: A | Discharge status: Home / Self Care | Source: Ambulatory Visit | Attending: Family Medicine | Admitting: Family Medicine

## 2023-11-28 ENCOUNTER — Other Ambulatory Visit (HOSPITAL_COMMUNITY)
Admission: RE | Admit: 2023-11-28 | Discharge: 2023-11-28 | Disposition: A | Discharge status: Home / Self Care | Source: Ambulatory Visit

## 2023-11-28 ENCOUNTER — Ambulatory Visit: Payer: Self-pay | Admitting: Family Medicine

## 2023-11-28 VITALS — BP 121/83 | HR 92 | Ht 66.0 in | Wt 204.0 lb

## 2023-11-28 DIAGNOSIS — R102 Pelvic and perineal pain unspecified side: Secondary | ICD-10-CM | POA: Insufficient documentation

## 2023-11-28 NOTE — Progress Notes (Signed)
 BP 121/83   Pulse 92   Ht 5' 6 (1.676 m)   Wt 204 lb (92.5 kg)   SpO2 100%   BMI 32.93 kg/m    Subjective:   Patient ID: Jenna Mendoza, female    DOB: [birth year], 33 y.o.   MRN: [id number]  HPI: Jenna Mendoza is a 33 y.o. female presenting on 11/28/2023 for Abdominal Pain (LLQ)   Discussed the use of AI scribe software for clinical note transcription with the patient, who gave verbal consent to proceed.  History of Present Illness       Patient is coming in today with complaints of pelvic pain that been in the left lower quadrant, been going on for about 4 days.  She says she is having spotting and some light vaginal discharge over the past 4 days as well.  She has not been sexually active for the past 4 days.  She is married in a steady relationship and has a current IUD and has had it for a few years.  She denies any urinary burning pain or blood in her urine.  She denies any irregular bowel issues than her normal.  She denies any blood in her stool.  She denies any constipation or nausea or vomiting.   Relevant past medical, surgical, family and social history reviewed and updated as indicated. Interim medical history since our last visit reviewed. Allergies and medications reviewed and updated.  Review of Systems  Constitutional:  Negative for chills and fever.  Eyes:  Negative for visual disturbance.  Respiratory:  Negative for chest tightness and shortness of breath.   Cardiovascular:  Negative for chest pain and leg swelling.  Gastrointestinal:  Negative for constipation, diarrhea, nausea and vomiting.  Genitourinary:  Positive for pelvic pain, vaginal bleeding and vaginal discharge. Negative for difficulty urinating, dysuria and vaginal pain.  Musculoskeletal:  Negative for back pain and gait problem.  Skin:  Negative for rash.  Neurological:  Negative for light-headedness and headaches.  Psychiatric/Behavioral:  Negative for agitation and behavioral problems.    All other systems reviewed and are negative.   Per HPI unless specifically indicated above   Allergies as of 11/28/2023       Reactions   Shellfish Allergy Anaphylaxis, Swelling        Medication List        Accurate as of November 28, 2023  9:47 AM. If you have any questions, ask your nurse or doctor.          STOP taking these medications    phentermine  37.5 MG capsule Stopped by: Fonda LABOR Cruz Devilla       TAKE these medications    acetaminophen  500 MG tablet Commonly known as: TYLENOL  Take 1,000 mg by mouth every 6 (six) hours as needed for mild pain or headache.   buPROPion  150 MG 24 hr tablet Commonly known as: WELLBUTRIN  XL Take 1 tablet (150 mg total) by mouth daily.   Mirena  (52 MG) 20 MCG/24HR Iud Generic drug: levonorgestrel  1 Intra Uterine Device by Intrauterine route continuous as needed.   ondansetron  4 MG disintegrating tablet Commonly known as: ZOFRAN -ODT Take 4 mg by mouth every 8 (eight) hours as needed for nausea.   vortioxetine  HBr 10 MG Tabs tablet Commonly known as: Trintellix  Take 1 tablet (10 mg total) by mouth daily.   Zepbound  7.5 MG/0.5ML Pen Generic drug: tirzepatide  Inject 7.5 mg into the skin once a week.         Objective:  BP 121/83   Pulse 92   Ht 5' 6 (1.676 m)   Wt 204 lb (92.5 kg)   SpO2 100%   BMI 32.93 kg/m   Wt Readings from Last 3 Encounters:  11/28/23 204 lb (92.5 kg)  08/29/23 208 lb (94.3 kg)  08/08/23 208 lb (94.3 kg)    Physical Exam Vitals and nursing note reviewed. Exam conducted with a chaperone present.  Constitutional:      Appearance: She is well-developed.  Abdominal:     Tenderness: There is abdominal tenderness in the left lower quadrant. There is no guarding or rebound. Negative signs include Murphy's sign.  Genitourinary:    Exam position: Lithotomy position.     Cervix: Normal.     Uterus: Normal.      Adnexa:        Right: No mass, tenderness or fullness.         Left:  Tenderness present. No mass or fullness.    Neurological:     Mental Status: She is alert.                 Assessment & Plan:   Problem List Items Addressed This Visit   None Visit Diagnoses       Pelvic pain    -  Primary   Relevant Orders   US  PELVIC COMPLETE WITH TRANSVAGINAL   Cervicovaginal ancillary only           Will send her for pelvic ultrasound and will send off her wet prep.  Follow-up after that         Follow up plan: Return if symptoms worsen or fail to improve.  Counseling provided for all of the vaccine components Orders Placed This Encounter  Procedures   US  PELVIC COMPLETE WITH TRANSVAGINAL    Fonda Levins, MD [facility] 11/28/2023, 9:47 AM

## 2023-11-29 LAB — CERVICOVAGINAL ANCILLARY ONLY
Bacterial Vaginitis (gardnerella): NEGATIVE
Candida Glabrata: NEGATIVE
Candida Vaginitis: NEGATIVE
Chlamydia: NEGATIVE
Comment: NEGATIVE
Comment: NEGATIVE
Comment: NEGATIVE
Comment: NEGATIVE
Comment: NEGATIVE
Comment: NORMAL
Neisseria Gonorrhea: NEGATIVE
Trichomonas: NEGATIVE

## 2024-01-26 ENCOUNTER — Encounter: Payer: Self-pay | Admitting: Family Medicine

## 2024-01-27 ENCOUNTER — Other Ambulatory Visit: Payer: Self-pay

## 2024-01-27 MED ORDER — ONDANSETRON 4 MG PO TBDP: 4.0000 mg | ORAL_TABLET | Freq: Three times a day (TID) | ORAL | 0 refills | Status: AC | PRN

## 2024-02-08 ENCOUNTER — Other Ambulatory Visit: Payer: Self-pay | Admitting: Family Medicine

## 2024-02-08 DIAGNOSIS — F32A Depression, unspecified: Secondary | ICD-10-CM

## 2024-02-19 ENCOUNTER — Other Ambulatory Visit: Payer: Self-pay

## 2024-02-19 ENCOUNTER — Encounter: Payer: Self-pay | Admitting: Family Medicine

## 2024-02-19 MED ORDER — ZEPBOUND 7.5 MG/0.5ML ~~LOC~~ SOAJ: 7.5000 mg | SUBCUTANEOUS | 0 refills | Status: AC

## 2024-02-19 NOTE — Telephone Encounter (Signed)
 Last seen in Nov for pelvic pain. No f/u scheduled. Ok to send refill or need to see?

## 2024-02-24 ENCOUNTER — Ambulatory Visit: Admitting: Nurse Practitioner

## 2024-03-04 ENCOUNTER — Ambulatory Visit: Admitting: Family Medicine
